# Patient Record
Sex: Male | Born: 1960 | Race: White | Hispanic: No | Marital: Single | Smoking: Never smoker
Health system: Southern US, Community
[De-identification: ages and names within clinical notes are randomized; demographics above are authoritative.]

## PROBLEM LIST (undated history)

## (undated) DIAGNOSIS — E119 Type 2 diabetes mellitus without complications: Secondary | ICD-10-CM

---

## 2010-04-23 ENCOUNTER — Emergency Department: Payer: Self-pay | Admitting: Emergency Medicine

## 2012-05-08 ENCOUNTER — Emergency Department: Payer: Self-pay | Admitting: Emergency Medicine

## 2012-05-08 LAB — CBC
MCH: 32.9 pg (ref 26.0–34.0)
MCHC: 34.6 g/dL (ref 32.0–36.0)
MCV: 95 fL (ref 80–100)
Platelet: 215 10*3/uL (ref 150–440)
RBC: 4.99 10*6/uL (ref 4.40–5.90)
RDW: 12.1 % (ref 11.5–14.5)
WBC: 8.5 10*3/uL (ref 3.8–10.6)

## 2012-09-13 ENCOUNTER — Emergency Department: Payer: Self-pay | Admitting: Emergency Medicine

## 2016-05-19 ENCOUNTER — Emergency Department: Payer: Self-pay

## 2016-05-19 ENCOUNTER — Inpatient Hospital Stay
Admission: EM | Admit: 2016-05-19 | Discharge: 2016-05-22 | DRG: 897 | Disposition: A | Discharge status: Home / Self Care | Payer: Self-pay | Attending: Internal Medicine | Admitting: Internal Medicine

## 2016-05-19 ENCOUNTER — Encounter: Payer: Self-pay | Admitting: Radiology

## 2016-05-19 DIAGNOSIS — E119 Type 2 diabetes mellitus without complications: Secondary | ICD-10-CM | POA: Diagnosis present

## 2016-05-19 DIAGNOSIS — F10239 Alcohol dependence with withdrawal, unspecified: Secondary | ICD-10-CM

## 2016-05-19 DIAGNOSIS — M25572 Pain in left ankle and joints of left foot: Secondary | ICD-10-CM | POA: Diagnosis present

## 2016-05-19 DIAGNOSIS — R296 Repeated falls: Secondary | ICD-10-CM | POA: Diagnosis present

## 2016-05-19 DIAGNOSIS — F10930 Alcohol use, unspecified with withdrawal, uncomplicated: Secondary | ICD-10-CM

## 2016-05-19 DIAGNOSIS — R29898 Other symptoms and signs involving the musculoskeletal system: Secondary | ICD-10-CM

## 2016-05-19 DIAGNOSIS — Z590 Homelessness: Secondary | ICD-10-CM

## 2016-05-19 DIAGNOSIS — F10929 Alcohol use, unspecified with intoxication, unspecified: Secondary | ICD-10-CM

## 2016-05-19 DIAGNOSIS — F10230 Alcohol dependence with withdrawal, uncomplicated: Principal | ICD-10-CM | POA: Diagnosis present

## 2016-05-19 DIAGNOSIS — W19XXXA Unspecified fall, initial encounter: Secondary | ICD-10-CM | POA: Diagnosis present

## 2016-05-19 DIAGNOSIS — D6959 Other secondary thrombocytopenia: Secondary | ICD-10-CM | POA: Diagnosis present

## 2016-05-19 DIAGNOSIS — E876 Hypokalemia: Secondary | ICD-10-CM | POA: Diagnosis present

## 2016-05-19 DIAGNOSIS — Z9181 History of falling: Secondary | ICD-10-CM

## 2016-05-19 DIAGNOSIS — E519 Thiamine deficiency, unspecified: Secondary | ICD-10-CM | POA: Diagnosis present

## 2016-05-19 DIAGNOSIS — F10939 Alcohol use, unspecified with withdrawal, unspecified: Secondary | ICD-10-CM

## 2016-05-19 DIAGNOSIS — I10 Essential (primary) hypertension: Secondary | ICD-10-CM | POA: Diagnosis present

## 2016-05-19 DIAGNOSIS — Z23 Encounter for immunization: Secondary | ICD-10-CM

## 2016-05-19 HISTORY — DX: Type 2 diabetes mellitus without complications: E11.9

## 2016-05-19 LAB — URINE DRUG SCREEN, QUALITATIVE (ARMC ONLY)
Amphetamines, Ur Screen: NOT DETECTED
Barbiturates, Ur Screen: NOT DETECTED
Benzodiazepine, Ur Scrn: NOT DETECTED
CANNABINOID 50 NG, UR ~~LOC~~: NOT DETECTED
COCAINE METABOLITE, UR ~~LOC~~: NOT DETECTED
MDMA (ECSTASY) UR SCREEN: NOT DETECTED
Methadone Scn, Ur: NOT DETECTED
OPIATE, UR SCREEN: NOT DETECTED
PHENCYCLIDINE (PCP) UR S: NOT DETECTED
TRICYCLIC, UR SCREEN: NOT DETECTED

## 2016-05-19 LAB — COMPREHENSIVE METABOLIC PANEL
ALT: 165 U/L — ABNORMAL HIGH (ref 17–63)
AST: 353 U/L — ABNORMAL HIGH (ref 15–41)
Albumin: 4.6 g/dL (ref 3.5–5.0)
Alkaline Phosphatase: 74 U/L (ref 38–126)
Anion gap: 14 (ref 5–15)
BUN: 7 mg/dL (ref 6–20)
CHLORIDE: 99 mmol/L — AB (ref 101–111)
CO2: 21 mmol/L — ABNORMAL LOW (ref 22–32)
Calcium: 8.6 mg/dL — ABNORMAL LOW (ref 8.9–10.3)
Creatinine, Ser: 0.73 mg/dL (ref 0.61–1.24)
GFR calc non Af Amer: >60 mL/min (ref >60)
Glucose, Bld: 166 mg/dL — ABNORMAL HIGH (ref 65–99)
POTASSIUM: 3.4 mmol/L — AB (ref 3.5–5.1)
SODIUM: 134 mmol/L — AB (ref 135–145)
Total Bilirubin: 1.3 mg/dL — ABNORMAL HIGH (ref 0.3–1.2)
Total Protein: 7.8 g/dL (ref 6.5–8.1)

## 2016-05-19 LAB — PROTIME-INR
INR: 1.08
Prothrombin Time: 14 seconds (ref 11.4–15.2)

## 2016-05-19 LAB — CBC
HCT: 41.9 % (ref 40.0–52.0)
Hemoglobin: 14.7 g/dL (ref 13.0–18.0)
MCH: 34 pg (ref 26.0–34.0)
MCHC: 35 g/dL (ref 32.0–36.0)
MCV: 97.1 fL (ref 80.0–100.0)
Platelets: 115 10*3/uL — ABNORMAL LOW (ref 150–440)
RBC: 4.32 MIL/uL — ABNORMAL LOW (ref 4.40–5.90)
RDW: 13.2 % (ref 11.5–14.5)
WBC: 4.2 10*3/uL (ref 3.8–10.6)

## 2016-05-19 LAB — URINALYSIS, ROUTINE W REFLEX MICROSCOPIC
BILIRUBIN URINE: NEGATIVE
Glucose, UA: NEGATIVE mg/dL
Hgb urine dipstick: NEGATIVE
Ketones, ur: NEGATIVE mg/dL
Leukocytes, UA: NEGATIVE
NITRITE: NEGATIVE
PH: 6 (ref 5.0–8.0)
Protein, ur: NEGATIVE mg/dL
SPECIFIC GRAVITY, URINE: 1.017 (ref 1.005–1.030)

## 2016-05-19 LAB — TROPONIN I

## 2016-05-19 LAB — GLUCOSE, CAPILLARY: Glucose-Capillary: 104 mg/dL — ABNORMAL HIGH (ref 65–99)

## 2016-05-19 LAB — MAGNESIUM: MAGNESIUM: 1.9 mg/dL (ref 1.7–2.4)

## 2016-05-19 LAB — APTT: aPTT: 27 seconds (ref 24–36)

## 2016-05-19 LAB — ETHANOL: ALCOHOL ETHYL (B): 346 mg/dL — AB (ref <5)

## 2016-05-19 MED ORDER — DOCUSATE SODIUM 100 MG PO CAPS
100.0000 mg | ORAL_CAPSULE | Freq: Two times a day (BID) | ORAL | Status: DC | PRN
  Administered 2016-05-20: 100 mg via ORAL
  Filled 2016-05-19: qty 1

## 2016-05-19 MED ORDER — LORAZEPAM 2 MG/ML IJ SOLN
1.5000 mg | Freq: Once | INTRAMUSCULAR | Status: AC
  Administered 2016-05-19: 1.5 mg via INTRAVENOUS
  Filled 2016-05-19: qty 1

## 2016-05-19 MED ORDER — PNEUMOCOCCAL VAC POLYVALENT 25 MCG/0.5ML IJ INJ
0.5000 mL | INJECTION | INTRAMUSCULAR | Status: AC
  Administered 2016-05-20: 08:00:00 0.5 mL via INTRAMUSCULAR
  Filled 2016-05-19: qty 0.5

## 2016-05-19 MED ORDER — SODIUM CHLORIDE 0.9 % IV BOLUS (SEPSIS)
1000.0000 mL | Freq: Once | INTRAVENOUS | Status: AC
  Administered 2016-05-19: 1000 mL via INTRAVENOUS

## 2016-05-19 MED ORDER — LORAZEPAM 2 MG/ML IJ SOLN
2.0000 mg | INTRAMUSCULAR | Status: DC | PRN
  Administered 2016-05-19: 2 mg via INTRAVENOUS
  Filled 2016-05-19: qty 1

## 2016-05-19 MED ORDER — SODIUM CHLORIDE 0.9 % IV SOLN
INTRAVENOUS | Status: DC
  Administered 2016-05-19 – 2016-05-20 (×4): via INTRAVENOUS

## 2016-05-19 MED ORDER — THIAMINE HCL 100 MG/ML IJ SOLN: 100.0000 mg | Freq: Every day | INTRAMUSCULAR | Status: DC

## 2016-05-19 MED ORDER — LORAZEPAM 2 MG PO TABS
2.0000 mg | ORAL_TABLET | ORAL | Status: DC | PRN
  Administered 2016-05-20 – 2016-05-22 (×8): 2 mg via ORAL
  Filled 2016-05-19 (×9): qty 1

## 2016-05-19 MED ORDER — INSULIN ASPART 100 UNIT/ML ~~LOC~~ SOLN
0.0000 [IU] | Freq: Three times a day (TID) | SUBCUTANEOUS | Status: DC
  Administered 2016-05-21: 1 [IU] via SUBCUTANEOUS
  Filled 2016-05-19: qty 1

## 2016-05-19 MED ORDER — HEPARIN SODIUM (PORCINE) 5000 UNIT/ML IJ SOLN
5000.0000 [IU] | Freq: Three times a day (TID) | INTRAMUSCULAR | Status: DC
  Administered 2016-05-20 – 2016-05-22 (×7): 5000 [IU] via SUBCUTANEOUS
  Filled 2016-05-19 (×7): qty 1

## 2016-05-19 MED ORDER — ADULT MULTIVITAMIN W/MINERALS CH
1.0000 | ORAL_TABLET | Freq: Every day | ORAL | Status: DC
  Administered 2016-05-19 – 2016-05-22 (×4): 1 via ORAL
  Filled 2016-05-19 (×4): qty 1

## 2016-05-19 MED ORDER — VITAMIN B-1 100 MG PO TABS
100.0000 mg | ORAL_TABLET | Freq: Every day | ORAL | Status: DC
  Administered 2016-05-19 – 2016-05-22 (×4): 100 mg via ORAL
  Filled 2016-05-19 (×4): qty 1

## 2016-05-19 MED ORDER — ASPIRIN 81 MG PO CHEW
324.0000 mg | CHEWABLE_TABLET | Freq: Once | ORAL | Status: AC
  Administered 2016-05-19: 324 mg via ORAL
  Filled 2016-05-19: qty 4

## 2016-05-19 MED ORDER — IOPAMIDOL (ISOVUE-370) INJECTION 76%
75.0000 mL | Freq: Once | INTRAVENOUS | Status: AC | PRN
  Administered 2016-05-19: 75 mL via INTRAVENOUS

## 2016-05-19 MED ORDER — FOLIC ACID 1 MG PO TABS
1.0000 mg | ORAL_TABLET | Freq: Every day | ORAL | Status: DC
  Administered 2016-05-19 – 2016-05-22 (×4): 1 mg via ORAL
  Filled 2016-05-19 (×5): qty 1
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
## 2016-05-19 NOTE — H&P (Signed)
[facility] [place] [facility]   PATIENT NAME: Tyler Sexton    MR#:  [id number]  DATE OF BIRTH:  [birth year]  DATE OF ADMISSION:  05/19/2016  PRIMARY CARE PHYSICIAN: No PCP Per Patient   REQUESTING/REFERRING PHYSICIAN: Quale  CHIEF COMPLAINT:   Chief Complaint  Patient presents with  . Alcohol Intoxication    HISTORY OF PRESENT ILLNESS: Tyler Sexton  is a 56 y.o. 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 He had 2-3 falls in last few weeks because of alcoholism and have some pain in his left ankle due to a fall. For last 2-3 days he started having some headache and he also have complain of palpitation. He have his right leg weakness also and he feels he will be falling down if he tried to stand up concerned with this he came to emergency room. CT scan of the head and CT angiogram are negative for any acute injuries his x-ray on left feet also does not show any acute fractures but he appears to be going into alcohol withdrawal and so given to hospitalist team for further management as patient agrees he would like to quit alcohol. There is also a concern by ER physician offered him having a stroke so suggested to do an MRI on the brain  PAST MEDICAL HISTORY:   Past Medical History:  Diagnosis Date  . Diabetes mellitus without complication (HCC)     PAST SURGICAL HISTORY: No past surgical history on file.  SOCIAL HISTORY:  Social History  Substance Use Topics  . Smoking status: Never Smoker  . Smokeless tobacco: Not on file  . Alcohol use Yes    FAMILY HISTORY:  Family History  Problem Relation Age of Onset  . Cancer Mother   . Cancer Father     DRUG ALLERGIES: No Known Allergies  REVIEW OF SYSTEMS:   CONSTITUTIONAL: No fever, fatigue or  weakness. He have imbalance. EYES: No blurred or double vision.  EARS, NOSE, AND THROAT: No tinnitus or ear pain.  RESPIRATORY: No cough, shortness of breath, wheezing or hemoptysis.  CARDIOVASCULAR: No chest pain, orthopnea, edema.  GASTROINTESTINAL: No nausea, vomiting, diarrhea or abdominal pain.  GENITOURINARY: No dysuria, hematuria.  ENDOCRINE: No polyuria, nocturia,  HEMATOLOGY: No anemia, easy bruising or bleeding SKIN: No rash or lesion. MUSCULOSKELETAL: No joint pain or arthritis. Left ankle pain.  NEUROLOGIC: No tingling, numbness, weakness.  PSYCHIATRY: No anxiety or depression.   MEDICATIONS AT HOME:  Prior to Admission medications   Not on File      PHYSICAL EXAMINATION:   VITAL SIGNS: Blood pressure (!) 155/99, pulse (!) 120, temperature 98.9 F (37.2 C), resp. rate 18, height  (1.626 m), weight 79.4 kg (175 lb), SpO2 94 %.  GENERAL:  56 y.o.-year-old patient lying in the bed with no acute distress.  EYES: Pupils equal, round, reactive to light and accommodation. No scleral icterus. Extraocular muscles intact.  HEENT: Head atraumatic, normocephalic. Oropharynx and nasopharynx clear.  NECK:  Supple, no jugular venous distention. No thyroid enlargement, no tenderness.  LUNGS: Normal breath sounds bilaterally, no wheezing, rales,rhonchi or crepitation. No use of accessory muscles of respiration.  CARDIOVASCULAR: S1, S2 normal. No murmurs, rubs, or gallops.  ABDOMEN: Soft,  nontender, nondistended. Bowel sounds present. No organomegaly or mass.  EXTREMITIES: No pedal edema, cyanosis, or clubbing. Tender on pressure on left ankle. NEUROLOGIC: Cranial nerves II through XII are intact. Muscle strength 5/5 in all extremities. Sensation intact. Gait not checked. He has some shaking on his limbs. PSYCHIATRIC: The patient is alert and oriented x 3.  SKIN: No obvious rash, lesion, or ulcer. His face and chest and his arms appear flushed. he appears in early alcohol  withdrawal.  LABORATORY PANEL:   CBC  Recent Labs Lab 05/19/16 1752  WBC 4.2  HGB 14.7  HCT 41.9  PLT 115*  MCV 97.1  MCH 34.0  MCHC 35.0  RDW 13.2   ------------------------------------------------------------------------------------------------------------------  Chemistries   Recent Labs Lab 05/19/16 1752  NA 134*  K 3.4*  CL 99*  CO2 21*  GLUCOSE 166*  BUN 7  CREATININE 0.73  CALCIUM 8.6*  AST 353*  ALT 165*  ALKPHOS 74  BILITOT 1.3*   ------------------------------------------------------------------------------------------------------------------ estimated creatinine clearance is 100.5 mL/min (by C-G formula based on SCr of 0.73 mg/dL). ------------------------------------------------------------------------------------------------------------------ No results for input(s): TSH, T4TOTAL, T3FREE, THYROIDAB in the last 72 hours.  Invalid input(s): FREET3   Coagulation profile  Recent Labs Lab 05/19/16 1920  INR 1.08   ------------------------------------------------------------------------------------------------------------------- No results for input(s): DDIMER in the last 72 hours. -------------------------------------------------------------------------------------------------------------------  Cardiac Enzymes  Recent Labs Lab 05/19/16 1920  TROPONINI <0.03   ------------------------------------------------------------------------------------------------------------------ Invalid input(s): POCBNP  ---------------------------------------------------------------------------------------------------------------  Urinalysis No results found for: COLORURINE, APPEARANCEUR, LABSPEC, PHURINE, GLUCOSEU, HGBUR, BILIRUBINUR, KETONESUR, PROTEINUR, UROBILINOGEN, NITRITE, LEUKOCYTESUR   RADIOLOGY: Ct Angio Head W Or Wo Contrast  Result Date: 05/19/2016 CLINICAL DATA:  Acute right leg weakness and numbness. EXAM: CT ANGIOGRAPHY HEAD AND NECK  TECHNIQUE: Multidetector CT imaging of the head and neck was performed using the standard protocol during bolus administration of intravenous contrast. Multiplanar CT image reconstructions and MIPs were obtained to evaluate the vascular anatomy. Carotid stenosis measurements (when applicable) are obtained utilizing NASCET criteria, using the distal internal carotid diameter as the denominator. CONTRAST:  75 mL Isovue 370 COMPARISON:  None. FINDINGS: CT HEAD FINDINGS Brain: There is no evidence of acute cortical infarct, intracranial hemorrhage, mass, midline shift, or extra-axial fluid collection. There is very mild cerebral and cerebellar atrophy. Vascular: Calcified atherosclerosis at the skullbase. No hyperdense vessel. Skull: No fracture or focal osseous lesion. Sinuses: Visualized paranasal sinuses and mastoid air cells are clear. Orbits: Unremarkable. Review of the MIP images confirms the above findings CTA NECK FINDINGS Aortic arch: Standard 3 vessel aortic arch with mild calcified atherosclerotic plaque. Widely patent brachiocephalic and subclavian arteries. Right carotid system: Patent without evidence of dissection. Calcified plaque is mild in the proximal and mid common carotid artery and moderate about the carotid bifurcation without significant stenosis. Left carotid system: Patent without evidence of dissection. Mild-to-moderate calcified plaque in the mid common carotid artery and about the carotid bifurcation without stenosis. Vertebral arteries: Patent without evidence of dissection or stenosis. Moderately to strongly dominant left vertebral artery. Skeleton: Mild lower cervical disc degeneration. Other neck: No mass or lymph node enlargement. Upper chest: Clear lung apices. Review of the MIP images confirms the above findings CTA HEAD FINDINGS Anterior circulation: The internal carotid arteries are patent from skullbase to carotid termini. There is mild right and moderate left carotid siphon  calcified plaque without significant stenosis. ACAs and MCAs are patent without evidence of major branch occlusion or significant stenosis. No aneurysm. Posterior circulation: The intracranial vertebral arteries are patent without  stenosis. The right vertebral artery is markedly hypoplastic distal to the PICA origin. Left PICA also appears patent. Left AICA appears dominant. SCA origins are patent bilaterally. Basilar artery is widely patent. There is a patent left posterior communicating artery with hypoplastic left P1 segment. PCAs are patent without evidence of significant stenosis. No aneurysm. Venous sinuses: Patent. Anatomic variants: Hypoplastic left P1. Delayed phase: No abnormal enhancement. Review of the MIP images confirms the above findings IMPRESSION: 1. No evidence of acute intracranial abnormality. 2. No large vessel occlusion. 3. 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 Electronically Signed   By: Sebastian Ache M.D.   On: 05/19/2016 19:20   Ct Angio Neck W And/or Wo Contrast  Result Date: 05/19/2016 CLINICAL DATA:  Acute right leg weakness and numbness. EXAM: CT ANGIOGRAPHY HEAD AND NECK TECHNIQUE: Multidetector CT imaging of the head and neck was performed using the standard protocol during bolus administration of intravenous contrast. Multiplanar CT image reconstructions and MIPs were obtained to evaluate the vascular anatomy. Carotid stenosis measurements (when applicable) are obtained utilizing NASCET criteria, using the distal internal carotid diameter as the denominator. CONTRAST:  75 mL Isovue 370 COMPARISON:  None. FINDINGS: CT HEAD FINDINGS Brain: There is no evidence of acute cortical infarct, intracranial hemorrhage, mass, midline shift, or extra-axial fluid collection. There is very mild cerebral and cerebellar atrophy. Vascular: Calcified atherosclerosis at the skullbase. No hyperdense vessel. Skull: No fracture or focal osseous lesion.  Sinuses: Visualized paranasal sinuses and mastoid air cells are clear. Orbits: Unremarkable. Review of the MIP images confirms the above findings CTA NECK FINDINGS Aortic arch: Standard 3 vessel aortic arch with mild calcified atherosclerotic plaque. Widely patent brachiocephalic and subclavian arteries. Right carotid system: Patent without evidence of dissection. Calcified plaque is mild in the proximal and mid common carotid artery and moderate about the carotid bifurcation without significant stenosis. Left carotid system: Patent without evidence of dissection. Mild-to-moderate calcified plaque in the mid common carotid artery and about the carotid bifurcation without stenosis. Vertebral arteries: Patent without evidence of dissection or stenosis. Moderately to strongly dominant left vertebral artery. Skeleton: Mild lower cervical disc degeneration. Other neck: No mass or lymph node enlargement. Upper chest: Clear lung apices. Review of the MIP images confirms the above findings CTA HEAD FINDINGS Anterior circulation: The internal carotid arteries are patent from skullbase to carotid termini. There is mild right and moderate left carotid siphon calcified plaque without significant stenosis. ACAs and MCAs are patent without evidence of major branch occlusion or significant stenosis. No aneurysm. Posterior circulation: The intracranial vertebral arteries are patent without stenosis. The right vertebral artery is markedly hypoplastic distal to the PICA origin. Left PICA also appears patent. Left AICA appears dominant. SCA origins are patent bilaterally. Basilar artery is widely patent. There is a patent left posterior communicating artery with hypoplastic left P1 segment. PCAs are patent without evidence of significant stenosis. No aneurysm. Venous sinuses: Patent. Anatomic variants: Hypoplastic left P1. Delayed phase: No abnormal enhancement. Review of the MIP images confirms the above findings IMPRESSION: 1. No  evidence of acute intracranial abnormality. 2. No large vessel occlusion. 3. 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 Electronically Signed   By: Sebastian Ache M.D.   On: 05/19/2016 19:20   Dg Foot Complete Left  Result Date: 05/19/2016 CLINICAL DATA:  Status post fall, with left foot pain. Initial encounter. EXAM: LEFT FOOT - COMPLETE 3+ VIEW COMPARISON:  None. FINDINGS: There is no evidence of fracture or  dislocation. The joint spaces are preserved. There is no evidence of talar subluxation; the subtalar joint is unremarkable in appearance. A plantar calcaneal spur is noted. An ankle joint effusion is noted. IMPRESSION: 1. No evidence of fracture or dislocation. 2. Ankle joint effusion seen. Electronically Signed   By: Roanna Raider M.D.   On: 05/19/2016 19:52    EKG: Orders placed or performed during the hospital encounter of 05/19/16  . ED EKG  . ED EKG    IMPRESSION AND PLAN:  * Alcohol withdrawal   Patient is at very high risk of going into alcohol withdrawal he already appears to have early signs of withdrawal.   We'll give IV fluids, give Ativan IV and by mouth as needed.    * Bilateral lower activity weakness    CT had an angiogram are negative.    Most likely this may be secondary to thiamine deficiency   We will give IV, check his magnesium and replace if needed.  * Diabetes   Keep on insulin sliding scale coverage.  * Elevated LFTs   Likely due to excessive alcoholism, continue monitoring.  All the records are reviewed and case discussed with ED provider. Management plans discussed with the patient, family and they are in agreement.  CODE STATUS: Full code Code Status History    This patient does not have a recorded code status. Please follow your organizational policy for patients in this situation.       TOTAL TIME TAKING CARE OF THIS PATIENT: 50 minutes.    Altamese Dilling M.D on 05/19/2016   Between 7am to  6pm - Pager - [phone]  After 6pm go to [email or web address] - Social research officer, government  [facility]  Office  [phone]  CC: Primary care physician; No PCP Per Patient   Note: This dictation was prepared with Dragon dictation along with smaller phrase technology. Any transcriptional errors that result from this process are unintentional.

## 2016-05-19 NOTE — ED Provider Notes (Signed)
[facility] Emergency Department Provider Note   ____________________________________________   First MD Initiated Contact with Patient 05/19/16 1826     (approximate)  I have reviewed the triage vital signs and the nursing notes.   HISTORY  Chief Complaint Not feeling right, can't walk   HPI Tyler Sexton is a 56 y.o. male reports that this morning he just hasn't been feeling right, and feeling slightly nauseated. No fever neck pain or headache. He does however report that feels like his right leg is numb, can't seem to use it very well. He feels somewhat weak all over, in the last couple hours is hard to start to feel like its racing, he started feeling tremulous, and thinks he starting to "go into withdrawals. He reports he drank over 16 beers daily for about 10 years now.  He was not intending to stop today, but reports he feels so weak, tired, and can't walk that he hasn't been able to drink anything since this morning  He reports rather abruptly noticing when he woke up at about 8 AM that his right leg just in feel right today. He went to bed last night, was normal at that time but not exactly sure what time he went to bed. [place] and is not quite aware of the time that he would've gone to bed.   Past Medical History:  Diagnosis Date  . Diabetes mellitus without complication (HCC)    Denies previous medical history, but thinks she may have at least a history of high blood pressure Patient Active Problem List   Diagnosis Date Noted  . Alcohol withdrawal (HCC) 05/19/2016  takes no medications  History reviewed. No pertinent surgical history.  Prior to Admission medications   Not on File    Allergies Patient has no known allergies.  Family History  Problem Relation Age of Onset  . Cancer Mother   . Cancer Father     Social History Social History  Substance Use Topics  . Smoking status: Never Smoker  . Smokeless  tobacco: Never Used  . Alcohol use 10.8 oz/week    18 Cans of beer per week  he uses alcohol regularly He does not use any illicit drugs  Review of Systems Constitutional: No fever/chills Eyes: No visual changes. ENT: No sore throat. Cardiovascular: Denies chest pain.heart feels like it's racing slightly Respiratory: Denies shortness of breath. Gastrointestinal: No abdominal pain.  No vomiting.  No diarrhea.  No constipation. Genitourinary: Negative for dysuria. Musculoskeletal: Negative for back pain. Skin: Negative for rash. Neurological: Negative for headaches.  10-point ROS otherwise negative.  ____________________________________________   PHYSICAL EXAM:  VITAL SIGNS: ED Triage Vitals [05/19/16 1753]  Enc Vitals Group     BP (!) 155/99     Pulse Rate (!) 121     Resp 18     Temp 98.2 F (36.8 C)     Temp Source Oral     SpO2 100 %     Weight 175 lb (79.4 kg)     Height 5\' 4"  (1.626 m)     Head Circumference      Peak Flow      Pain Score 0     Pain Loc      Pain Edu?      Excl. in GC?     Constitutional: Alert and oriented. Slightly anxious, slightly tremulous Eyes: Conjunctivae are normal. PERRL. EOMI. Head: Atraumatic. Nose: No congestion/rhinnorhea. Mouth/Throat: Mucous membranes are moist.  Oropharynx non-erythematous. Neck: No stridor.  No rigidity. Cardiovascular: tachycardic rate, regular rhythm. Grossly normal heart sounds.  Good peripheral circulation. Respiratory: Normal respiratory effort.  No retractions. Lungs CTAB. Gastrointestinal: Soft and nontender. No distention.  usculoskeletal: No lower extremity tenderness nor edema.  No joint effusions. Neurologic:  Normal speech and language.   NIH score equals 3, performed by me at bedside. The patient has right leg pronator drift. The patient has normal cranial nerve exam. Extraocular movements are normal. Visual fields are normal. Patient has 5 out of 5 strength in all extremities except the  right lower extremity which is approximately 4 out of 5. There is no numbness or gross, acute sensory abnormality in the extremities bilaterallyexcept for the right lower thigh foot and leg where he reports inability to discriminate touch. No speech disturbance. No dysarthria. No aphasia. No ataxia. Normal finger nose finger bilat. Patient speaking in full and clear sentences. Moderate upper extremity bilateral tremulousness   Skin:  Skin is warm, dry and intact. No rash noted. Psychiatric: Mood and affect are normal. Speech and behavior are normal.  ____________________________________________   LABS (all labs ordered are listed, but only abnormal results are displayed)  Labs Reviewed  COMPREHENSIVE METABOLIC PANEL - Abnormal; Notable for the following:       Result Value   Sodium 134 (*)    Potassium 3.4 (*)    Chloride 99 (*)    CO2 21 (*)    Glucose, Bld 166 (*)    Calcium 8.6 (*)    AST 353 (*)    ALT 165 (*)    Total Bilirubin 1.3 (*)    All other components within normal limits  ETHANOL - Abnormal; Notable for the following:    Alcohol, Ethyl (B) 346 (*)    All other components within normal limits  CBC - Abnormal; Notable for the following:    RBC 4.32 (*)    Platelets 115 (*)    All other components within normal limits  URINALYSIS, ROUTINE W REFLEX MICROSCOPIC - Abnormal; Notable for the following:    Color, Urine YELLOW (*)    APPearance CLEAR (*)    All other components within normal limits  GLUCOSE, CAPILLARY - Abnormal; Notable for the following:    Glucose-Capillary 104 (*)    All other components within normal limits  APTT  TROPONIN I  PROTIME-INR  MAGNESIUM  URINE DRUG SCREEN, QUALITATIVE ([facility] ONLY)  BASIC METABOLIC PANEL  CBC  HIV ANTIBODY (ROUTINE TESTING)  CBG MONITORING, ED   ____________________________________________  EKG  Reviewed and interpreted by me at 1906 Sinus tachycardia, heart rate 120 QRS 80 QTc 440 No acute ischemic  changes, normal T waves ____________________________________________  RADIOLOGY  Ct Angio Head W Or Wo Contrast  Result Date: 05/19/2016 CLINICAL DATA:  Acute right leg weakness and numbness. EXAM: CT ANGIOGRAPHY HEAD AND NECK TECHNIQUE: Multidetector CT imaging of the head and neck was performed using the standard protocol during bolus administration of intravenous contrast. Multiplanar CT image reconstructions and MIPs were obtained to evaluate the vascular anatomy. Carotid stenosis measurements (when applicable) are obtained utilizing NASCET criteria, using the distal internal carotid diameter as the denominator. CONTRAST:  75 mL Isovue 370 COMPARISON:  None. FINDINGS: CT HEAD FINDINGS Brain: There is no evidence of acute cortical infarct, intracranial hemorrhage, mass, midline shift, or extra-axial fluid collection. There is very mild cerebral and cerebellar atrophy. Vascular: Calcified atherosclerosis at the skullbase. No hyperdense vessel. Skull: No fracture or focal osseous lesion.  Sinuses: Visualized paranasal sinuses and mastoid air cells are clear. Orbits: Unremarkable. Review of the MIP images confirms the above findings CTA NECK FINDINGS Aortic arch: Standard 3 vessel aortic arch with mild calcified atherosclerotic plaque. Widely patent brachiocephalic and subclavian arteries. Right carotid system: Patent without evidence of dissection. Calcified plaque is mild in the proximal and mid common carotid artery and moderate about the carotid bifurcation without significant stenosis. Left carotid system: Patent without evidence of dissection. Mild-to-moderate calcified plaque in the mid common carotid artery and about the carotid bifurcation without stenosis. Vertebral arteries: Patent without evidence of dissection or stenosis. Moderately to strongly dominant left vertebral artery. Skeleton: Mild lower cervical disc degeneration. Other neck: No mass or lymph node enlargement. Upper chest: Clear lung  apices. Review of the MIP images confirms the above findings CTA HEAD FINDINGS Anterior circulation: The internal carotid arteries are patent from skullbase to carotid termini. There is mild right and moderate left carotid siphon calcified plaque without significant stenosis. ACAs and MCAs are patent without evidence of major branch occlusion or significant stenosis. No aneurysm. Posterior circulation: The intracranial vertebral arteries are patent without stenosis. The right vertebral artery is markedly hypoplastic distal to the PICA origin. Left PICA also appears patent. Left AICA appears dominant. SCA origins are patent bilaterally. Basilar artery is widely patent. There is a patent left posterior communicating artery with hypoplastic left P1 segment. PCAs are patent without evidence of significant stenosis. No aneurysm. Venous sinuses: Patent. Anatomic variants: Hypoplastic left P1. Delayed phase: No abnormal enhancement. Review of the MIP images confirms the above findings IMPRESSION: 1. No evidence of acute intracranial abnormality. 2. No large vessel occlusion. 3. 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 Electronically Signed   By: Sebastian Ache M.D.   On: 05/19/2016 19:20   Ct Angio Neck W And/or Wo Contrast  Result Date: 05/19/2016 CLINICAL DATA:  Acute right leg weakness and numbness. EXAM: CT ANGIOGRAPHY HEAD AND NECK TECHNIQUE: Multidetector CT imaging of the head and neck was performed using the standard protocol during bolus administration of intravenous contrast. Multiplanar CT image reconstructions and MIPs were obtained to evaluate the vascular anatomy. Carotid stenosis measurements (when applicable) are obtained utilizing NASCET criteria, using the distal internal carotid diameter as the denominator. CONTRAST:  75 mL Isovue 370 COMPARISON:  None. FINDINGS: CT HEAD FINDINGS Brain: There is no evidence of acute cortical infarct, intracranial hemorrhage,  mass, midline shift, or extra-axial fluid collection. There is very mild cerebral and cerebellar atrophy. Vascular: Calcified atherosclerosis at the skullbase. No hyperdense vessel. Skull: No fracture or focal osseous lesion. Sinuses: Visualized paranasal sinuses and mastoid air cells are clear. Orbits: Unremarkable. Review of the MIP images confirms the above findings CTA NECK FINDINGS Aortic arch: Standard 3 vessel aortic arch with mild calcified atherosclerotic plaque. Widely patent brachiocephalic and subclavian arteries. Right carotid system: Patent without evidence of dissection. Calcified plaque is mild in the proximal and mid common carotid artery and moderate about the carotid bifurcation without significant stenosis. Left carotid system: Patent without evidence of dissection. Mild-to-moderate calcified plaque in the mid common carotid artery and about the carotid bifurcation without stenosis. Vertebral arteries: Patent without evidence of dissection or stenosis. Moderately to strongly dominant left vertebral artery. Skeleton: Mild lower cervical disc degeneration. Other neck: No mass or lymph node enlargement. Upper chest: Clear lung apices. Review of the MIP images confirms the above findings CTA HEAD FINDINGS Anterior circulation: The internal carotid arteries are patent from skullbase to carotid  termini. There is mild right and moderate left carotid siphon calcified plaque without significant stenosis. ACAs and MCAs are patent without evidence of major branch occlusion or significant stenosis. No aneurysm. Posterior circulation: The intracranial vertebral arteries are patent without stenosis. The right vertebral artery is markedly hypoplastic distal to the PICA origin. Left PICA also appears patent. Left AICA appears dominant. SCA origins are patent bilaterally. Basilar artery is widely patent. There is a patent left posterior communicating artery with hypoplastic left P1 segment. PCAs are patent  without evidence of significant stenosis. No aneurysm. Venous sinuses: Patent. Anatomic variants: Hypoplastic left P1. Delayed phase: No abnormal enhancement. Review of the MIP images confirms the above findings IMPRESSION: 1. No evidence of acute intracranial abnormality. 2. No large vessel occlusion. 3. 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 Electronically Signed   By: Sebastian Ache M.D.   On: 05/19/2016 19:20   Dg Foot Complete Left  Result Date: 05/19/2016 CLINICAL DATA:  Status post fall, with left foot pain. Initial encounter. EXAM: LEFT FOOT - COMPLETE 3+ VIEW COMPARISON:  None. FINDINGS: There is no evidence of fracture or dislocation. The joint spaces are preserved. There is no evidence of talar subluxation; the subtalar joint is unremarkable in appearance. A plantar calcaneal spur is noted. An ankle joint effusion is noted. IMPRESSION: 1. No evidence of fracture or dislocation. 2. Ankle joint effusion seen. Electronically Signed   By: Roanna Raider M.D.   On: 05/19/2016 19:52    ____________________________________________   PROCEDURES  Procedure(s) performed: None  Procedures  Critical Care performed: Yes, see critical care note(s)  CRITICAL CARE Performed by: Sharyn Creamer   Total critical care time: 40 minutes  Critical care time was exclusive of separately billable procedures and treating other patients.  Critical care was necessary to treat or prevent imminent or life-threatening deterioration.  Critical care was time spent personally by me on the following activities: development of treatment plan with patient and/or surrogate as well as nursing, discussions with consultants, evaluation of patient's response to treatment, examination of patient, obtaining history from patient or surrogate, ordering and performing treatments and interventions, ordering and review of laboratory studies, ordering and review of radiographic studies,  pulse oximetry and re-evaluation of patient's condition.  Patient presents with concerns of an acute neurologic deficit including sudden weakness in the right lower leg. He is outside of the TPA window, and given his history I'm acutely concerned about a potential intracranial hemorrhage. No signs or symptoms of subarachnoid hemorrhage based on history. We'll proceed with CT angiography to further evaluate. In addition he does now appear to be going through withdrawal, I will start him on Ativan. I'm concerned that the patient's having an acute medical event leading to his withdrawal, I am performing a broad workup at this time.He denies any infectious symptoms, but given his long-standing alcoholism this is still on differential. ____________________________________________   INITIAL IMPRESSION / ASSESSMENT AND PLAN / ED COURSE  Pertinent labs & imaging results that were available during my care of the patient were reviewed by me and considered in my medical decision making (see chart for details).  ----------------------------------------- 7:28 PM on 05/19/2016 -----------------------------------------  Patient's labs reviewed, somewhat expected slight elevation of transaminases given his heavy alcohol history. Alcohol level was quite elevated, and considering he is tremulous now like concerned he will develop significant withdrawals. At this point, the patient's tremors and vital signs have improved after Ativan, his CT does not demonstrate an acute stroke but  concern is still elevated given weakness numbness in the right foot. We'll give oral aspirin, admitted to hospital for further workup include treatment of detox, evaluation for neurologic deficit, and further workup about today's presentation. The patient is agreeable to plan reports feeling improved at this time.  He also reports he fell a few days ago and is having pain over the left heel, I have ordered an x-ray of this which will be  followed up as an inpatient. Foot x-ray neg for fracture.      ____________________________________________   FINAL CLINICAL IMPRESSION(S) / ED DIAGNOSES  Final diagnoses:  Alcoholic intoxication with complication (HCC)  Alcohol withdrawal syndrome without complication (HCC)  Weakness of right leg      NEW MEDICATIONS STARTED DURING THIS VISIT:  There are no discharge medications for this patient.    Note:  This document was prepared using Dragon voice recognition software and may include unintentional dictation errors.     Sharyn Creamer, MD 05/19/16 2322

## 2016-05-19 NOTE — ED Triage Notes (Addendum)
Pt to ed with c/o wanting detox.  Pt states he has been drinking about 18 cans of beer everyday for several years. Pt reports "I can't stand up, I will fall down" pt is homeless.  Pt denies SI, denies HI.

## 2016-05-20 LAB — BASIC METABOLIC PANEL
Anion gap: 8 (ref 5–15)
BUN: 6 mg/dL (ref 6–20)
CHLORIDE: 108 mmol/L (ref 101–111)
CO2: 24 mmol/L (ref 22–32)
CREATININE: 0.5 mg/dL — AB (ref 0.61–1.24)
Calcium: 8 mg/dL — ABNORMAL LOW (ref 8.9–10.3)
GFR calc Af Amer: >60 mL/min (ref >60)
GFR calc non Af Amer: >60 mL/min (ref >60)
GLUCOSE: 92 mg/dL (ref 65–99)
POTASSIUM: 3.3 mmol/L — AB (ref 3.5–5.1)
SODIUM: 140 mmol/L (ref 135–145)

## 2016-05-20 LAB — COMPREHENSIVE METABOLIC PANEL
ALT: 148 U/L — AB (ref 17–63)
AST: 289 U/L — ABNORMAL HIGH (ref 15–41)
Albumin: 4.2 g/dL (ref 3.5–5.0)
Alkaline Phosphatase: 73 U/L (ref 38–126)
Anion gap: 12 (ref 5–15)
BILIRUBIN TOTAL: 2.4 mg/dL — AB (ref 0.3–1.2)
BUN: 5 mg/dL — ABNORMAL LOW (ref 6–20)
CALCIUM: 9 mg/dL (ref 8.9–10.3)
CO2: 26 mmol/L (ref 22–32)
CREATININE: 0.57 mg/dL — AB (ref 0.61–1.24)
Chloride: 99 mmol/L — ABNORMAL LOW (ref 101–111)
Glucose, Bld: 116 mg/dL — ABNORMAL HIGH (ref 65–99)
Potassium: 3.2 mmol/L — ABNORMAL LOW (ref 3.5–5.1)
Sodium: 137 mmol/L (ref 135–145)
TOTAL PROTEIN: 6.9 g/dL (ref 6.5–8.1)

## 2016-05-20 LAB — GLUCOSE, CAPILLARY
GLUCOSE-CAPILLARY: 83 mg/dL (ref 65–99)
GLUCOSE-CAPILLARY: 93 mg/dL (ref 65–99)
GLUCOSE-CAPILLARY: 137 mg/dL — AB (ref 65–99)
Glucose-Capillary: 95 mg/dL (ref 65–99)

## 2016-05-20 LAB — CBC
HEMATOCRIT: 35.6 % — AB (ref 40.0–52.0)
Hemoglobin: 12.4 g/dL — ABNORMAL LOW (ref 13.0–18.0)
MCH: 34.4 pg — AB (ref 26.0–34.0)
MCHC: 34.8 g/dL (ref 32.0–36.0)
MCV: 98.6 fL (ref 80.0–100.0)
PLATELETS: 71 10*3/uL — AB (ref 150–440)
RBC: 3.61 MIL/uL — ABNORMAL LOW (ref 4.40–5.90)
RDW: 13.2 % (ref 11.5–14.5)
WBC: 2.9 10*3/uL — AB (ref 3.8–10.6)

## 2016-05-20 MED ORDER — MAGNESIUM HYDROXIDE 400 MG/5ML PO SUSP
30.0000 mL | Freq: Every day | ORAL | Status: DC | PRN
  Administered 2016-05-20: 30 mL via ORAL
  Filled 2016-05-20: qty 30

## 2016-05-20 MED ORDER — LISINOPRIL 10 MG PO TABS
10.0000 mg | ORAL_TABLET | Freq: Every day | ORAL | Status: DC
  Administered 2016-05-20 – 2016-05-22 (×3): 10 mg via ORAL
  Filled 2016-05-20 (×3): qty 1

## 2016-05-20 MED ORDER — HYDROCHLOROTHIAZIDE 25 MG PO TABS
25.0000 mg | ORAL_TABLET | Freq: Every day | ORAL | Status: DC
  Administered 2016-05-20 – 2016-05-22 (×3): 25 mg via ORAL
  Filled 2016-05-20 (×3): qty 1

## 2016-05-20 MED ORDER — ACETAMINOPHEN 325 MG PO TABS
650.0000 mg | ORAL_TABLET | Freq: Four times a day (QID) | ORAL | Status: DC | PRN
  Administered 2016-05-20 (×2): 650 mg via ORAL
  Filled 2016-05-20 (×2): qty 2

## 2016-05-20 NOTE — Progress Notes (Signed)
Pt reports he is hungry; alert/talking with no reported problems swallowing; denies abdominal pain. MD notified for diet order, need for pain med for HA, no BM 5-6 days, elevated BP reading with pt reporting history of high blood pressure and states ," I was suppose to take medicine for it, but I never got it." Orders obtained. Assisted pt in placing diet order stating, "I don't have my glasses" ;reports hasn't eaten food in 5-6 days.

## 2016-05-20 NOTE — Clinical Social Work Note (Signed)
CSW received consult for homeless issues. CSW will visit when able.  Argentina Ponder, MSW, Theresia Majors [phone]

## 2016-05-20 NOTE — Clinical Social Work Note (Signed)
Clinical Social Work Assessment  Patient Details  Name: Tyler Sexton MRN: [id number] Date of Birth: 04-25-1960  Date of referral:  05/20/16               Reason for consult:  Substance Use/ETOH Abuse, Housing Concerns/Homelessness                Permission sought to share information with:    Permission granted to share information::     Name::        Agency::     Relationship::     Contact Information:     Housing/Transportation Living arrangements for the past 2 months:  Homeless Source of Information:  Patient Patient Interpreter Needed:  None Criminal Activity/Legal Involvement Pertinent to Current Situation/Hospitalization:  No - Comment as needed Significant Relationships:  None Lives with:  Self Do you feel safe going back to the place where you live?  No Need for family participation in patient care:  No (Coment)  Care giving concerns: Patient is homeless/significant alcohol use   Social Worker assessment / plan:  CSW met with patient at bedside to discuss homelessness and alcohol use. The patient reported [place], and he has no family or community support. He works as a Curator, and he primarily spends money on beer. He reports that he had not eaten in a week. The patient also reports that he typically drinks "an 18 pack of beer a day." This frequency has lasted at least the past year.  The CSW provided the patient with information about contacting the local shelter, as well as inpatient rehabilitation services. In addition, the CSW explained how different levels of substance treatment works. The patient indicated that he plans to "quit on [his] own." He is considering dc to the shelter once stable. CSW will con't to follow for additional dc needs.  Employment status:  Cytogeneticist information:  Self Pay (Medicaid Pending) PT Recommendations:  Not assessed at this time Information / Referral to community resources:   Shelter, Outpatient Substance Abuse Treatment Options, Residential Substance Abuse Treatment Options  Patient/Family's Response to care:  The patient thanked the CSW for assitance.   Patient/Family's Understanding of and Emotional Response to Diagnosis, Current Treatment, and Prognosis:  The patient is in the contemplation stage of change. He understands that he has a drinking problem; however, he does not understand the severity.   Emotional Assessment Appearance:  Appears stated age Attitude/Demeanor/Rapport:   (Pleasant) Affect (typically observed):  Accepting, Appropriate, Pleasant Orientation:  Oriented to Self, Oriented to Place, Oriented to  Time, Oriented to Situation Alcohol / Substance use:  Alcohol Use (The patient reports drinking 18 beers a day for at least the past year.) Psych involvement (Current and /or in the community):  No (Comment)  Discharge Needs  Concerns to be addressed:  Substance Abuse Concerns, Homelessness, Compliance Issues Concerns Readmission within the last 30 days:  No Current discharge risk:  Substance Abuse, Homeless Barriers to Discharge:  Homeless with medical needs, Inadequate or no insurance, Active Substance Use   Zettie Pho, LCSW 05/20/2016, 2:48 PM

## 2016-05-20 NOTE — Progress Notes (Signed)
Appetite fair and tolerating. Added lisinopril for elevated blood pressure with pt already given HCTZ. Ativan po given for withdrawal symptoms per CIWA with pt reporting he felt better.Had excellent results from colace and MOM with constipation resolved. Very unsteady when up with pronounced tremors. Tylenol effective for HA.

## 2016-05-20 NOTE — Progress Notes (Addendum)
[facility] - [place] at [facility]   PATIENT NAME: Tyler Sexton    MR#:  [id number]  DATE OF BIRTH:  09-07-1960  SUBJECTIVE:  CHIEF COMPLAINT:  Patient is jittery and anxious  REVIEW OF SYSTEMS:  CONSTITUTIONAL: No fever, fatigue or weakness.  EYES: No blurred or double vision.  EARS, NOSE, AND THROAT: No tinnitus or ear pain.  RESPIRATORY: No cough, shortness of breath, wheezing or hemoptysis.  CARDIOVASCULAR: No chest pain, orthopnea, edema.  GASTROINTESTINAL: No nausea, vomiting, diarrhea or abdominal pain.  GENITOURINARY: No dysuria, hematuria.  ENDOCRINE: No polyuria, nocturia,  HEMATOLOGY: No anemia, easy bruising or bleeding SKIN: No rash or lesion. MUSCULOSKELETAL: No joint pain or arthritis.   NEUROLOGIC: No tingling, numbness, weakness.  PSYCHIATRY: No anxiety or depression.   DRUG ALLERGIES:  No Known Allergies  VITALS:  Blood pressure (!) 176/95, pulse 96, temperature 98.4 F (36.9 C), temperature source Oral, resp. rate 20, height  (1.626 m), weight 78.1 kg (172 lb 3.2 oz), SpO2 95 %.  PHYSICAL EXAMINATION:  GENERAL:  56 y.o.-year-old patient lying in the bed with no acute distress.  EYES: Pupils equal, round, reactive to light and accommodation. No scleral icterus. Extraocular muscles intact.  HEENT: Head atraumatic, normocephalic. Oropharynx and nasopharynx clear.  NECK:  Supple, no jugular venous distention. No thyroid enlargement, no tenderness.  LUNGS: Normal breath sounds bilaterally, no wheezing, rales,rhonchi or crepitation. No use of accessory muscles of respiration.  CARDIOVASCULAR: S1, S2 normal. No murmurs, rubs, or gallops.  ABDOMEN: Soft, nontender, nondistended. Bowel sounds present. No organomegaly or mass.  EXTREMITIES: No pedal edema, cyanosis, or clubbing.  NEUROLOGIC: Cranial nerves II through XII are intact. Muscle strength 5/5 in all extremities. Sensation intact. Gait not checked.  PSYCHIATRIC: The patient  is alert and oriented x 3.  SKIN: No obvious rash, lesion, or ulcer.    LABORATORY PANEL:   CBC  Recent Labs Lab 05/20/16 0429  WBC 2.9*  HGB 12.4*  HCT 35.6*  PLT 71*   ------------------------------------------------------------------------------------------------------------------  Chemistries   Recent Labs Lab 05/19/16 1752 05/19/16 1920 05/20/16 0429  NA 134*  --  140  K 3.4*  --  3.3*  CL 99*  --  108  CO2 21*  --  24  GLUCOSE 166*  --  92  BUN 7  --  6  CREATININE 0.73  --  0.50*  CALCIUM 8.6*  --  8.0*  MG  --  1.9  --   AST 353*  --   --   ALT 165*  --   --   ALKPHOS 74  --   --   BILITOT 1.3*  --   --    ------------------------------------------------------------------------------------------------------------------  Cardiac Enzymes  Recent Labs Lab 05/19/16 1920  TROPONINI <0.03   ------------------------------------------------------------------------------------------------------------------  RADIOLOGY:  Ct Angio Head W Or Wo Contrast  Result Date: 05/19/2016 CLINICAL DATA:  Acute right leg weakness and numbness. EXAM: CT ANGIOGRAPHY HEAD AND NECK TECHNIQUE: Multidetector CT imaging of the head and neck was performed using the standard protocol during bolus administration of intravenous contrast. Multiplanar CT image reconstructions and MIPs were obtained to evaluate the vascular anatomy. Carotid stenosis measurements (when applicable) are obtained utilizing NASCET criteria, using the distal internal carotid diameter as the denominator. CONTRAST:  75 mL Isovue 370 COMPARISON:  None. FINDINGS: CT HEAD FINDINGS Brain: There is no evidence of acute cortical infarct, intracranial hemorrhage, mass, midline shift, or extra-axial fluid collection. There is very mild cerebral and cerebellar  atrophy. Vascular: Calcified atherosclerosis at the skullbase. No hyperdense vessel. Skull: No fracture or focal osseous lesion. Sinuses: Visualized paranasal sinuses and  mastoid air cells are clear. Orbits: Unremarkable. Review of the MIP images confirms the above findings CTA NECK FINDINGS Aortic arch: Standard 3 vessel aortic arch with mild calcified atherosclerotic plaque. Widely patent brachiocephalic and subclavian arteries. Right carotid system: Patent without evidence of dissection. Calcified plaque is mild in the proximal and mid common carotid artery and moderate about the carotid bifurcation without significant stenosis. Left carotid system: Patent without evidence of dissection. Mild-to-moderate calcified plaque in the mid common carotid artery and about the carotid bifurcation without stenosis. Vertebral arteries: Patent without evidence of dissection or stenosis. Moderately to strongly dominant left vertebral artery. Skeleton: Mild lower cervical disc degeneration. Other neck: No mass or lymph node enlargement. Upper chest: Clear lung apices. Review of the MIP images confirms the above findings CTA HEAD FINDINGS Anterior circulation: The internal carotid arteries are patent from skullbase to carotid termini. There is mild right and moderate left carotid siphon calcified plaque without significant stenosis. ACAs and MCAs are patent without evidence of major branch occlusion or significant stenosis. No aneurysm. Posterior circulation: The intracranial vertebral arteries are patent without stenosis. The right vertebral artery is markedly hypoplastic distal to the PICA origin. Left PICA also appears patent. Left AICA appears dominant. SCA origins are patent bilaterally. Basilar artery is widely patent. There is a patent left posterior communicating artery with hypoplastic left P1 segment. PCAs are patent without evidence of significant stenosis. No aneurysm. Venous sinuses: Patent. Anatomic variants: Hypoplastic left P1. Delayed phase: No abnormal enhancement. Review of the MIP images confirms the above findings IMPRESSION: 1. No evidence of acute intracranial abnormality.  2. No large vessel occlusion. 3. 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 Electronically Signed   By: Sebastian Ache M.D.   On: 05/19/2016 19:20   Ct Angio Neck W And/or Wo Contrast  Result Date: 05/19/2016 CLINICAL DATA:  Acute right leg weakness and numbness. EXAM: CT ANGIOGRAPHY HEAD AND NECK TECHNIQUE: Multidetector CT imaging of the head and neck was performed using the standard protocol during bolus administration of intravenous contrast. Multiplanar CT image reconstructions and MIPs were obtained to evaluate the vascular anatomy. Carotid stenosis measurements (when applicable) are obtained utilizing NASCET criteria, using the distal internal carotid diameter as the denominator. CONTRAST:  75 mL Isovue 370 COMPARISON:  None. FINDINGS: CT HEAD FINDINGS Brain: There is no evidence of acute cortical infarct, intracranial hemorrhage, mass, midline shift, or extra-axial fluid collection. There is very mild cerebral and cerebellar atrophy. Vascular: Calcified atherosclerosis at the skullbase. No hyperdense vessel. Skull: No fracture or focal osseous lesion. Sinuses: Visualized paranasal sinuses and mastoid air cells are clear. Orbits: Unremarkable. Review of the MIP images confirms the above findings CTA NECK FINDINGS Aortic arch: Standard 3 vessel aortic arch with mild calcified atherosclerotic plaque. Widely patent brachiocephalic and subclavian arteries. Right carotid system: Patent without evidence of dissection. Calcified plaque is mild in the proximal and mid common carotid artery and moderate about the carotid bifurcation without significant stenosis. Left carotid system: Patent without evidence of dissection. Mild-to-moderate calcified plaque in the mid common carotid artery and about the carotid bifurcation without stenosis. Vertebral arteries: Patent without evidence of dissection or stenosis. Moderately to strongly dominant left vertebral artery. Skeleton: Mild  lower cervical disc degeneration. Other neck: No mass or lymph node enlargement. Upper chest: Clear lung apices. Review of the MIP images confirms  the above findings CTA HEAD FINDINGS Anterior circulation: The internal carotid arteries are patent from skullbase to carotid termini. There is mild right and moderate left carotid siphon calcified plaque without significant stenosis. ACAs and MCAs are patent without evidence of major branch occlusion or significant stenosis. No aneurysm. Posterior circulation: The intracranial vertebral arteries are patent without stenosis. The right vertebral artery is markedly hypoplastic distal to the PICA origin. Left PICA also appears patent. Left AICA appears dominant. SCA origins are patent bilaterally. Basilar artery is widely patent. There is a patent left posterior communicating artery with hypoplastic left P1 segment. PCAs are patent without evidence of significant stenosis. No aneurysm. Venous sinuses: Patent. Anatomic variants: Hypoplastic left P1. Delayed phase: No abnormal enhancement. Review of the MIP images confirms the above findings IMPRESSION: 1. No evidence of acute intracranial abnormality. 2. No large vessel occlusion. 3. 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 Electronically Signed   By: Sebastian Ache M.D.   On: 05/19/2016 19:20   Dg Foot Complete Left  Result Date: 05/19/2016 CLINICAL DATA:  Status post fall, with left foot pain. Initial encounter. EXAM: LEFT FOOT - COMPLETE 3+ VIEW COMPARISON:  None. FINDINGS: There is no evidence of fracture or dislocation. The joint spaces are preserved. There is no evidence of talar subluxation; the subtalar joint is unremarkable in appearance. A plantar calcaneal spur is noted. An ankle joint effusion is noted. IMPRESSION: 1. No evidence of fracture or dislocation. 2. Ankle joint effusion seen. Electronically Signed   By: Roanna Raider M.D.   On: 05/19/2016 19:52    EKG:    Orders placed or performed during the hospital encounter of 05/19/16  . ED EKG  . ED EKG    ASSESSMENT AND PLAN:   * Alcohol withdrawal CIWA Multivitamin, thiamine and folate   Patient is at very high risk of going into alcohol withdrawal he already appears to have early signs of withdrawal.   We'll give IV fluids, give Ativan IV and by mouth as needed. Outpatient alcohol anonymous  * NEW Diagnosis of hypertension Patient is started on hydrochlorothiazide and this will titrate as needed    * Bilateral lower activity weakness    CT had an angiogram are negative.    Most likely this may be secondary to thiamine deficiency, provide thiamine    * Diabetes  on insulin sliding scale coverage.  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   Likely due to excessive alcoholism, continue monitoring.  *Disposition patient is homeless Social services consulted      All the records are reviewed and case discussed with Care Management/Social Workerr. Management plans discussed with the patient, family and they are in agreement.  CODE STATUS: fc   TOTAL TIME TAKING CARE OF THIS PATIENT: 36 minutes.   POSSIBLE D/C IN 1-2  DAYS, DEPENDING ON CLINICAL CONDITION.  Note: This dictation was prepared with Dragon dictation along with smaller phrase technology. Any transcriptional errors that result from this process are unintentional.   Ramonita Lab M.D on 05/20/2016 at 12:43 PM  Between 7am to 6pm - Pager - [phone] After 6pm go to [email or web address] - password [email or web address] [facility]  [facility] [facility]  Office  [phone]  CC: Primary care physician; No PCP Per Patient

## 2016-05-21 LAB — COMPREHENSIVE METABOLIC PANEL
ALT: 157 U/L — ABNORMAL HIGH (ref 17–63)
AST: 301 U/L — AB (ref 15–41)
Albumin: 4.2 g/dL (ref 3.5–5.0)
Alkaline Phosphatase: 90 U/L (ref 38–126)
Anion gap: 8 (ref 5–15)
BILIRUBIN TOTAL: 2.3 mg/dL — AB (ref 0.3–1.2)
BUN: 7 mg/dL (ref 6–20)
CO2: 29 mmol/L (ref 22–32)
Calcium: 10 mg/dL (ref 8.9–10.3)
Chloride: 99 mmol/L — ABNORMAL LOW (ref 101–111)
Creatinine, Ser: 0.66 mg/dL (ref 0.61–1.24)
GFR calc Af Amer: >60 mL/min (ref >60)
GFR calc non Af Amer: >60 mL/min (ref >60)
Glucose, Bld: 121 mg/dL — ABNORMAL HIGH (ref 65–99)
POTASSIUM: 3.6 mmol/L (ref 3.5–5.1)
SODIUM: 136 mmol/L (ref 135–145)
TOTAL PROTEIN: 7.1 g/dL (ref 6.5–8.1)

## 2016-05-21 LAB — CBC
HEMATOCRIT: 39.5 % — AB (ref 40.0–52.0)
HEMOGLOBIN: 13.8 g/dL (ref 13.0–18.0)
MCH: 34.3 pg — AB (ref 26.0–34.0)
MCHC: 35 g/dL (ref 32.0–36.0)
MCV: 98.1 fL (ref 80.0–100.0)
Platelets: 66 10*3/uL — ABNORMAL LOW (ref 150–440)
RBC: 4.02 MIL/uL — AB (ref 4.40–5.90)
RDW: 12.6 % (ref 11.5–14.5)
WBC: 3.9 10*3/uL (ref 3.8–10.6)

## 2016-05-21 LAB — GLUCOSE, CAPILLARY
Glucose-Capillary: 99 mg/dL (ref 65–99)
Glucose-Capillary: 124 mg/dL — ABNORMAL HIGH (ref 65–99)
Glucose-Capillary: 114 mg/dL — ABNORMAL HIGH (ref 65–99)
Glucose-Capillary: 118 mg/dL — ABNORMAL HIGH (ref 65–99)

## 2016-05-21 LAB — HIV ANTIBODY (ROUTINE TESTING W REFLEX): HIV SCREEN 4TH GENERATION: NONREACTIVE

## 2016-05-21 LAB — POTASSIUM: POTASSIUM: 2.8 mmol/L — AB (ref 3.5–5.1)

## 2016-05-21 LAB — MAGNESIUM: Magnesium: 2 mg/dL (ref 1.7–2.4)

## 2016-05-21 MED ORDER — KCL IN DEXTROSE-NACL 20-5-0.9 MEQ/L-%-% IV SOLN
INTRAVENOUS | Status: DC
  Administered 2016-05-21 (×2): via INTRAVENOUS
  Filled 2016-05-21 (×5): qty 1000

## 2016-05-21 MED ORDER — LABETALOL HCL 5 MG/ML IV SOLN
10.0000 mg | INTRAVENOUS | Status: DC | PRN
  Filled 2016-05-21: qty 4

## 2016-05-21 MED ORDER — GLUCERNA SHAKE PO LIQD
237.0000 mL | Freq: Three times a day (TID) | ORAL | Status: DC
  Administered 2016-05-21 (×2): 237 mL via ORAL

## 2016-05-21 MED ORDER — POTASSIUM CHLORIDE CRYS ER 20 MEQ PO TBCR
40.0000 meq | EXTENDED_RELEASE_TABLET | ORAL | Status: AC
  Administered 2016-05-21 (×2): 40 meq via ORAL
  Filled 2016-05-21 (×2): qty 2

## 2016-05-21 NOTE — Progress Notes (Addendum)
Initial Nutrition Assessment  DOCUMENTATION CODES:   Not applicable  INTERVENTION:  1. Glucerna Shake po TID, each supplement provides 220 kcal and 10 grams of protein  NUTRITION DIAGNOSIS:   Predicted suboptimal nutrient intake related to chronic illness as evidenced by per patient/family report.  GOAL:   Patient will meet greater than or equal to 90% of their needs  MONITOR:   PO intake, I & O's, Labs, Weight trends, Supplement acceptance  REASON FOR ASSESSMENT:   Malnutrition Screening Tool    ASSESSMENT:   Tyler Sexton  is a 56 y.o. 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 He had 2-3 falls in last few weeks because of alcoholism and have some pain in his left ankle due to a fall.  Spoke with Tyler Sexton at bedside. States his normal diet is an 18 pack and a pack of nabs. Reports UBW of 170-180# Ate half a hamburger for lunch - states the fries he had were too salty. Documented PO so far 30-100% Nutrition-Focused physical exam completed. Findings are no fat depletion, no muscle depletion, and mild edema.  No acute complaints. Did have n/v upon admission - but has since resolved. Labs and medications reviewed: K 2.8 Banana Bag D5 1/2 NS + 20 KCL @ 32mL/hr --> 306 calories   Diet Order:  Diet Carb Modified Fluid consistency: Thin; Room service appropriate? Yes  Skin:  Reviewed, no issues  Last BM:  05/20/2016  Height:   Ht Readings from Last 1 Encounters:  05/19/16  (1.626 m)    Weight:   Wt Readings from Last 1 Encounters:  05/19/16 172 lb 3.2 oz (78.1 kg)    Ideal Body Weight:  59.09 kg  BMI:  Body mass index is 29.56 kg/m.  Estimated Nutritional Needs:   Kcal:  1700-2000 calories  Protein:  78-94 gm  Fluid:  >/=  1.7L  EDUCATION NEEDS:   No education needs identified at this time  Tyler Sexton. Odis Wickey, MS, RD LDN Inpatient Clinical Dietitian Pager [phone]

## 2016-05-21 NOTE — Progress Notes (Signed)
Pt feeling better for most of the day, however is now scoring a 10 on CIWA. PO ativan given. Pt states "The only way I can cope with these withdrawals is by sleeping.". Pt rested comfortably for most of the day, also had a good appetite and drank a glucerna shake. Otilio Jefferson, RN

## 2016-05-21 NOTE — Progress Notes (Signed)
[facility] - [place] at [facility]   PATIENT NAME: Tyler Sexton    MR#:  [id number]  DATE OF BIRTH:  1960/04/10  SUBJECTIVE:  CHIEF COMPLAINT:  Patient is feeling much better today. No complaints  REVIEW OF SYSTEMS:  CONSTITUTIONAL: No fever, fatigue or weakness.  EYES: No blurred or double vision.  EARS, NOSE, AND THROAT: No tinnitus or ear pain.  RESPIRATORY: No cough, shortness of breath, wheezing or hemoptysis.  CARDIOVASCULAR: No chest pain, orthopnea, edema.  GASTROINTESTINAL: No nausea, vomiting, diarrhea or abdominal pain.  GENITOURINARY: No dysuria, hematuria.  ENDOCRINE: No polyuria, nocturia,  HEMATOLOGY: No anemia, easy bruising or bleeding SKIN: No rash or lesion. MUSCULOSKELETAL: No joint pain or arthritis.   NEUROLOGIC: No tingling, numbness, weakness.  PSYCHIATRY: No anxiety or depression.   DRUG ALLERGIES:  No Known Allergies  VITALS:  Blood pressure 138/89, pulse 90, temperature 98.4 F (36.9 C), temperature source Oral, resp. rate 18, height  (1.626 m), weight 78.1 kg (172 lb 3.2 oz), SpO2 98 %.  PHYSICAL EXAMINATION:  GENERAL:  56 y.o.-year-old patient lying in the bed with no acute distress.  EYES: Pupils equal, round, reactive to light and accommodation. No scleral icterus. Extraocular muscles intact.  HEENT: Head atraumatic, normocephalic. Oropharynx and nasopharynx clear.  NECK:  Supple, no jugular venous distention. No thyroid enlargement, no tenderness.  LUNGS: Normal breath sounds bilaterally, no wheezing, rales,rhonchi or crepitation. No use of accessory muscles of respiration.  CARDIOVASCULAR: S1, S2 normal. No murmurs, rubs, or gallops.  ABDOMEN: Soft, nontender, nondistended. Bowel sounds present. No organomegaly or mass.  EXTREMITIES: No pedal edema, cyanosis, or clubbing.  NEUROLOGIC: Cranial nerves II through XII are intact. Muscle strength 5/5 in all extremities. Sensation intact. Gait not checked.   PSYCHIATRIC: The patient is alert and oriented x 3.  SKIN: No obvious rash, lesion, or ulcer.    LABORATORY PANEL:   CBC  Recent Labs Lab 05/21/16 0435  WBC 3.9  HGB 13.8  HCT 39.5*  PLT 66*   ------------------------------------------------------------------------------------------------------------------  Chemistries   Recent Labs Lab 05/20/16 1309 05/21/16 0435  NA 137  --   K 3.2* 2.8*  CL 99*  --   CO2 26  --   GLUCOSE 116*  --   BUN 5*  --   CREATININE 0.57*  --   CALCIUM 9.0  --   MG  --  2.0  AST 289*  --   ALT 148*  --   ALKPHOS 73  --   BILITOT 2.4*  --    ------------------------------------------------------------------------------------------------------------------  Cardiac Enzymes  Recent Labs Lab 05/19/16 1920  TROPONINI <0.03   ------------------------------------------------------------------------------------------------------------------  RADIOLOGY:  Ct Angio Head W Or Wo Contrast  Result Date: 05/19/2016 CLINICAL DATA:  Acute right leg weakness and numbness. EXAM: CT ANGIOGRAPHY HEAD AND NECK TECHNIQUE: Multidetector CT imaging of the head and neck was performed using the standard protocol during bolus administration of intravenous contrast. Multiplanar CT image reconstructions and MIPs were obtained to evaluate the vascular anatomy. Carotid stenosis measurements (when applicable) are obtained utilizing NASCET criteria, using the distal internal carotid diameter as the denominator. CONTRAST:  75 mL Isovue 370 COMPARISON:  None. FINDINGS: CT HEAD FINDINGS Brain: There is no evidence of acute cortical infarct, intracranial hemorrhage, mass, midline shift, or extra-axial fluid collection. There is very mild cerebral and cerebellar atrophy. Vascular: Calcified atherosclerosis at the skullbase. No hyperdense vessel. Skull: No fracture or focal osseous lesion. Sinuses: Visualized paranasal sinuses and mastoid air cells  are clear. Orbits:  Unremarkable. Review of the MIP images confirms the above findings CTA NECK FINDINGS Aortic arch: Standard 3 vessel aortic arch with mild calcified atherosclerotic plaque. Widely patent brachiocephalic and subclavian arteries. Right carotid system: Patent without evidence of dissection. Calcified plaque is mild in the proximal and mid common carotid artery and moderate about the carotid bifurcation without significant stenosis. Left carotid system: Patent without evidence of dissection. Mild-to-moderate calcified plaque in the mid common carotid artery and about the carotid bifurcation without stenosis. Vertebral arteries: Patent without evidence of dissection or stenosis. Moderately to strongly dominant left vertebral artery. Skeleton: Mild lower cervical disc degeneration. Other neck: No mass or lymph node enlargement. Upper chest: Clear lung apices. Review of the MIP images confirms the above findings CTA HEAD FINDINGS Anterior circulation: The internal carotid arteries are patent from skullbase to carotid termini. There is mild right and moderate left carotid siphon calcified plaque without significant stenosis. ACAs and MCAs are patent without evidence of major branch occlusion or significant stenosis. No aneurysm. Posterior circulation: The intracranial vertebral arteries are patent without stenosis. The right vertebral artery is markedly hypoplastic distal to the PICA origin. Left PICA also appears patent. Left AICA appears dominant. SCA origins are patent bilaterally. Basilar artery is widely patent. There is a patent left posterior communicating artery with hypoplastic left P1 segment. PCAs are patent without evidence of significant stenosis. No aneurysm. Venous sinuses: Patent. Anatomic variants: Hypoplastic left P1. Delayed phase: No abnormal enhancement. Review of the MIP images confirms the above findings IMPRESSION: 1. No evidence of acute intracranial abnormality. 2. No large vessel occlusion. 3.  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 Electronically Signed   By: Sebastian Ache M.D.   On: 05/19/2016 19:20   Ct Angio Neck W And/or Wo Contrast  Result Date: 05/19/2016 CLINICAL DATA:  Acute right leg weakness and numbness. EXAM: CT ANGIOGRAPHY HEAD AND NECK TECHNIQUE: Multidetector CT imaging of the head and neck was performed using the standard protocol during bolus administration of intravenous contrast. Multiplanar CT image reconstructions and MIPs were obtained to evaluate the vascular anatomy. Carotid stenosis measurements (when applicable) are obtained utilizing NASCET criteria, using the distal internal carotid diameter as the denominator. CONTRAST:  75 mL Isovue 370 COMPARISON:  None. FINDINGS: CT HEAD FINDINGS Brain: There is no evidence of acute cortical infarct, intracranial hemorrhage, mass, midline shift, or extra-axial fluid collection. There is very mild cerebral and cerebellar atrophy. Vascular: Calcified atherosclerosis at the skullbase. No hyperdense vessel. Skull: No fracture or focal osseous lesion. Sinuses: Visualized paranasal sinuses and mastoid air cells are clear. Orbits: Unremarkable. Review of the MIP images confirms the above findings CTA NECK FINDINGS Aortic arch: Standard 3 vessel aortic arch with mild calcified atherosclerotic plaque. Widely patent brachiocephalic and subclavian arteries. Right carotid system: Patent without evidence of dissection. Calcified plaque is mild in the proximal and mid common carotid artery and moderate about the carotid bifurcation without significant stenosis. Left carotid system: Patent without evidence of dissection. Mild-to-moderate calcified plaque in the mid common carotid artery and about the carotid bifurcation without stenosis. Vertebral arteries: Patent without evidence of dissection or stenosis. Moderately to strongly dominant left vertebral artery. Skeleton: Mild lower cervical disc  degeneration. Other neck: No mass or lymph node enlargement. Upper chest: Clear lung apices. Review of the MIP images confirms the above findings CTA HEAD FINDINGS Anterior circulation: The internal carotid arteries are patent from skullbase to carotid termini. There is mild right and moderate  left carotid siphon calcified plaque without significant stenosis. ACAs and MCAs are patent without evidence of major branch occlusion or significant stenosis. No aneurysm. Posterior circulation: The intracranial vertebral arteries are patent without stenosis. The right vertebral artery is markedly hypoplastic distal to the PICA origin. Left PICA also appears patent. Left AICA appears dominant. SCA origins are patent bilaterally. Basilar artery is widely patent. There is a patent left posterior communicating artery with hypoplastic left P1 segment. PCAs are patent without evidence of significant stenosis. No aneurysm. Venous sinuses: Patent. Anatomic variants: Hypoplastic left P1. Delayed phase: No abnormal enhancement. Review of the MIP images confirms the above findings IMPRESSION: 1. No evidence of acute intracranial abnormality. 2. No large vessel occlusion. 3. 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 Electronically Signed   By: Sebastian Ache M.D.   On: 05/19/2016 19:20   Dg Foot Complete Left  Result Date: 05/19/2016 CLINICAL DATA:  Status post fall, with left foot pain. Initial encounter. EXAM: LEFT FOOT - COMPLETE 3+ VIEW COMPARISON:  None. FINDINGS: There is no evidence of fracture or dislocation. The joint spaces are preserved. There is no evidence of talar subluxation; the subtalar joint is unremarkable in appearance. A plantar calcaneal spur is noted. An ankle joint effusion is noted. IMPRESSION: 1. No evidence of fracture or dislocation. 2. Ankle joint effusion seen. Electronically Signed   By: Roanna Raider M.D.   On: 05/19/2016 19:52    EKG:   Orders placed or  performed during the hospital encounter of 05/19/16  . ED EKG  . ED EKG    ASSESSMENT AND PLAN:   * Alcohol withdrawal CIWA Multivitamin, thiamine and folate   Patient is at very high risk of going into alcohol withdrawal he already appears to have early signs of withdrawal.   We'll give IV fluids, give Ativan IV and by mouth as needed. Outpatient alcohol anonymous  * NEW Diagnosis of hypertension Patient is started on hydrochlorothiazide and this will titrate as needed    * Bilateral lower activity weakness    CT had an angiogram are negative.    Most likely this may be secondary to thiamine deficiency, provide thiamine    * Diabetes  on insulin sliding scale coverage.  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 AST and ALT are trending down. Total bili is elevated. Patient is not jaundiced Clinically symptomatic    counseled patient to stop drinking alcohol and outpatient alcohol anonymous  *Disposition patient is homeless Social services consulted, waiting for placement into a shelter most likely tomorrow      All the records are reviewed and case discussed with Care Management/Social Workerr. Management plans discussed with the patient, family and they are in agreement.  CODE STATUS: fc   TOTAL TIME TAKING CARE OF THIS PATIENT: 36 minutes.   POSSIBLE D/C IN 1-2  DAYS, DEPENDING ON CLINICAL CONDITION.  Note: This dictation was prepared with Dragon dictation along with smaller phrase technology. Any transcriptional errors that result from this process are unintentional.   Ramonita Lab M.D on 05/21/2016 at 1:44 PM  Between 7am to 6pm - Pager - [phone] After 6pm go to [email or web address] - password [email or web address] [facility]  [place] [facility]  Office  [phone]  CC: Primary care physician; No PCP Per  Patient

## 2016-05-22 LAB — BASIC METABOLIC PANEL
Anion gap: 8 (ref 5–15)
BUN: 8 mg/dL (ref 6–20)
CALCIUM: 9.2 mg/dL (ref 8.9–10.3)
CHLORIDE: 101 mmol/L (ref 101–111)
CO2: 28 mmol/L (ref 22–32)
Creatinine, Ser: 0.5 mg/dL — ABNORMAL LOW (ref 0.61–1.24)
Glucose, Bld: 118 mg/dL — ABNORMAL HIGH (ref 65–99)
Potassium: 3.2 mmol/L — ABNORMAL LOW (ref 3.5–5.1)
SODIUM: 137 mmol/L (ref 135–145)

## 2016-05-22 LAB — GLUCOSE, CAPILLARY
GLUCOSE-CAPILLARY: 108 mg/dL — AB (ref 65–99)
GLUCOSE-CAPILLARY: 106 mg/dL — AB (ref 65–99)

## 2016-05-22 MED ORDER — GLUCERNA SHAKE PO LIQD: 237.0000 mL | Freq: Three times a day (TID) | ORAL | 0 refills | Status: AC

## 2016-05-22 MED ORDER — ADULT MULTIVITAMIN W/MINERALS CH: 1.0000 | ORAL_TABLET | Freq: Every day | ORAL | Status: AC

## 2016-05-22 MED ORDER — POTASSIUM CHLORIDE CRYS ER 20 MEQ PO TBCR
40.0000 meq | EXTENDED_RELEASE_TABLET | Freq: Once | ORAL | Status: AC
  Administered 2016-05-22: 14:00:00 40 meq via ORAL
  Filled 2016-05-22: qty 2

## 2016-05-22 MED ORDER — FOLIC ACID 1 MG PO TABS: 1.0000 mg | ORAL_TABLET | Freq: Every day | ORAL | 0 refills | Status: AC

## 2016-05-22 MED ORDER — LISINOPRIL 20 MG PO TABS: 20.0000 mg | ORAL_TABLET | Freq: Every day | ORAL | Status: DC

## 2016-05-22 MED ORDER — HYDROCHLOROTHIAZIDE 25 MG PO TABS: 25.0000 mg | ORAL_TABLET | Freq: Every day | ORAL | 0 refills | Status: AC

## 2016-05-22 MED ORDER — THIAMINE HCL 100 MG PO TABS: 100.0000 mg | ORAL_TABLET | Freq: Every day | ORAL | Status: AC

## 2016-05-22 MED ORDER — DOCUSATE SODIUM 100 MG PO CAPS: 100.0000 mg | ORAL_CAPSULE | Freq: Two times a day (BID) | ORAL | 0 refills | Status: AC | PRN

## 2016-05-22 MED ORDER — LISINOPRIL 10 MG PO TABS
10.0000 mg | ORAL_TABLET | Freq: Once | ORAL | Status: AC
  Administered 2016-05-22: 14:00:00 10 mg via ORAL
  Filled 2016-05-22: qty 1

## 2016-05-22 MED ORDER — LISINOPRIL 20 MG PO TABS: 20.0000 mg | ORAL_TABLET | Freq: Every day | ORAL | 0 refills | Status: AC

## 2016-05-22 NOTE — Discharge Summary (Signed)
[facility] [place] at [facility]   PATIENT NAME: Tyler Sexton    MR#:  [id number]  DATE OF BIRTH:  September 13, 1960  DATE OF ADMISSION:  05/19/2016 ADMITTING PHYSICIAN: Altamese Dilling, MD  DATE OF DISCHARGE: 05/22/16 PRIMARY CARE PHYSICIAN: No PCP Per Patient    ADMISSION DIAGNOSIS:  Weakness of right leg [R29.898] Alcohol withdrawal syndrome without complication (HCC) [F10.230] Alcoholic intoxication with complication (HCC) [F10.929]  DISCHARGE DIAGNOSIS:  Principal Problem:   Alcohol withdrawal (HCC)  Hypokalemia  htn  DM SECONDARY DIAGNOSIS:   Past Medical History:  Diagnosis Date  . Diabetes mellitus without complication [facility])     HOSPITAL COURSE:  HISTORY OF PRESENT ILLNESS: Tyler Sexton  is a 56 y.o. 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 He had 2-3 falls in last few weeks because of alcoholism and have some pain in his left ankle due to a fall. For last 2-3 days he started having some headache and he also have complain of palpitation. He have his right leg weakness also and he feels he will be falling down if he tried to stand up concerned with this he came to emergency room. CT scan of the head and CT angiogram are negative for any acute injuries his x-ray on left feet also does not show any acute fractures but he appears to be going into alcohol withdrawal and so given to hospitalist team for further management as patient agrees he would like to quit alcohol. There is also a concern by ER physician offered him having a stroke so suggested to do an MRI on the brain  Hospital course   * Alcohol withdrawal CIWA Multivitamin, thiamine and folate Patient is at very high risk of going into alcohol withdrawal he already appears  to have early signs of withdrawal. We'll give IV fluids, give Ativan IV and by mouth as needed. Outpatient alcohol anonymous  * Hypokalemia- potassium repleted  * NEW Diagnosis of hypertension Patient is started on hydrochlorothiazide and this will titrate as needed  * Bilateral lower activity weakness CT had an angiogram are negative. Most likely this may be secondary to thiamine deficiency, provide thiamine  * Diabetes on insulin sliding scale coverage.  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 AST and ALT are trending down. Total bili is elevated. Patient is not jaundiced Clinically symptomatic  counseled patient to stop drinking alcohol and outpatient alcohol anonymous  *Disposition patient is homeless Social services consulted, waiting for placement into a shelter most likely tomorrow     DISCHARGE CONDITIONS:   stable  CONSULTS OBTAINED:     PROCEDURES none  DRUG ALLERGIES:  No Known Allergies  DISCHARGE MEDICATIONS:   Current Discharge Medication List    START taking these medications   Details  docusate sodium (COLACE) 100 MG capsule Take 1 capsule (100 mg total) by mouth 2 (two) times daily as needed for mild constipation. Qty: 10 capsule, Refills: 0    feeding supplement, GLUCERNA SHAKE, (GLUCERNA SHAKE) LIQD Take 237 mLs by mouth 3 (three) times daily between meals. Qty: 90 Can, Refills: 0    folic  acid (FOLVITE) 1 MG tablet Take 1 tablet (1 mg total) by mouth daily. Qty: 30 tablet, Refills: 0    hydrochlorothiazide (HYDRODIURIL) 25 MG tablet Take 1 tablet (25 mg total) by mouth daily. Qty: 30 tablet, Refills: 0    lisinopril (PRINIVIL,ZESTRIL) 20 MG tablet Take 1 tablet (20 mg total) by mouth daily. Qty: 30 tablet, Refills: 0    Multiple Vitamin (MULTIVITAMIN  WITH MINERALS) TABS tablet Take 1 tablet by mouth daily.    thiamine 100 MG tablet Take 1 tablet (100 mg total) by mouth daily.         DISCHARGE INSTRUCTIONS:  Stop drinking, outpatient follow-up with alcohol and anonymous Follow-up with primary care physician in a week   DIET:  Diabetic diet, low salt  DISCHARGE CONDITION:  Stable  ACTIVITY:  Activity as tolerated  OXYGEN:  Home Oxygen: No.   Oxygen Delivery: room air  DISCHARGE LOCATION:  home   If you experience worsening of your admission symptoms, develop shortness of breath, life threatening emergency, suicidal or homicidal thoughts you must seek medical attention immediately by calling 911 or calling your MD immediately  if symptoms less severe.  You Must read complete instructions/literature along with all the possible adverse reactions/side effects for all the Medicines you take and that have been prescribed to you. Take any new Medicines after you have completely understood and accpet all the possible adverse reactions/side effects.   Please note  You were cared for by a hospitalist during your hospital stay. If you have any questions about your discharge medications or the care you received while you were in the hospital after you are discharged, you can call the unit and asked to speak with the hospitalist on call if the hospitalist that took care of you is not available. Once you are discharged, your primary care physician will handle any further medical issues. Please note that NO REFILLS for any discharge medications will be authorized once you are discharged, as it is imperative that you return to your primary care physician (or establish a relationship with a primary care physician if you do not have one) for your aftercare needs so that they can reassess your need for medications and monitor your lab values.     Today  Chief Complaint  Patient presents with  . Alcohol Intoxication   Patient is doing  much better. Denies any anxiety and shakes. Wants to go to a shelt and is *Hypokalemia potassium repleteder  ROS:  CONSTITUTIONAL: Denies fevers, chills. Denies any fatigue, weakness.  EYES: Denies blurry vision, double vision, eye pain. EARS, NOSE, THROAT: Denies tinnitus, ear pain, hearing loss. RESPIRATORY: Denies cough, wheeze, shortness of breath.  CARDIOVASCULAR: Denies chest pain, palpitations, edema.  GASTROINTESTINAL: Denies nausea, vomiting, diarrhea, abdominal pain. Denies bright red blood per rectum. GENITOURINARY: Denies dysuria, hematuria. ENDOCRINE: Denies nocturia or thyroid problems. HEMATOLOGIC AND LYMPHATIC: Denies easy bruising or bleeding. SKIN: Denies rash or lesion. MUSCULOSKELETAL: Denies pain in neck, back, shoulder, knees, hips or arthritic symptoms.  NEUROLOGIC: Denies paralysis, paresthesias.  PSYCHIATRIC: Denies anxiety or depressive symptoms.   VITAL SIGNS:  Blood pressure (!) 167/96, pulse 92, temperature 98.4 F (36.9 C), temperature source Oral, resp. rate 20, height  (1.626 m), weight 78.1 kg (172 lb 3.2 oz), SpO2 98 %.  I/O:    Intake/Output Summary (Last 24 hours) at 05/22/16 1320 Last data filed at 05/22/16 0800  Gross per 24 hour  Intake  2045 ml  Output              600 ml  Net             1445 ml    PHYSICAL EXAMINATION:  GENERAL:  56 y.o.-year-old patient lying in the bed with no acute distress.  EYES: Pupils equal, round, reactive to light and accommodation. No scleral icterus. Extraocular muscles intact.  HEENT: Head atraumatic, normocephalic. Oropharynx and nasopharynx clear.  NECK:  Supple, no jugular venous distention. No thyroid enlargement, no tenderness.  LUNGS: Normal breath sounds bilaterally, no wheezing, rales,rhonchi or crepitation. No use of accessory muscles of respiration.  CARDIOVASCULAR: S1, S2 normal. No murmurs, rubs, or gallops.  ABDOMEN: Soft, non-tender, non-distended. Bowel sounds present. No  organomegaly or mass.  EXTREMITIES: No pedal edema, cyanosis, or clubbing.  NEUROLOGIC: Cranial nerves II through XII are intact. Muscle strength 5/5 in all extremities. Sensation intact. Gait not checked.  PSYCHIATRIC: The patient is alert and oriented x 3.  SKIN: No obvious rash, lesion, or ulcer.   DATA REVIEW:   CBC  Recent Labs Lab 05/21/16 0435  WBC 3.9  HGB 13.8  HCT 39.5*  PLT 66*    Chemistries   Recent Labs Lab 05/21/16 0435 05/21/16 1405 05/22/16 0440  NA  --  136 137  K 2.8* 3.6 3.2*  CL  --  99* 101  CO2  --  29 28  GLUCOSE  --  121* 118*  BUN  --  7 8  CREATININE  --  0.66 0.50*  CALCIUM  --  10.0 9.2  MG 2.0  --   --   AST  --  301*  --   ALT  --  157*  --   ALKPHOS  --  90  --   BILITOT  --  2.3*  --     Cardiac Enzymes  Recent Labs Lab 05/19/16 1920  TROPONINI <0.03    Microbiology Results  No results found for this or any previous visit.  RADIOLOGY:  Ct Angio Head W Or Wo Contrast  Result Date: 05/19/2016 CLINICAL DATA:  Acute right leg weakness and numbness. EXAM: CT ANGIOGRAPHY HEAD AND NECK TECHNIQUE: Multidetector CT imaging of the head and neck was performed using the standard protocol during bolus administration of intravenous contrast. Multiplanar CT image reconstructions and MIPs were obtained to evaluate the vascular anatomy. Carotid stenosis measurements (when applicable) are obtained utilizing NASCET criteria, using the distal internal carotid diameter as the denominator. CONTRAST:  75 mL Isovue 370 COMPARISON:  None. FINDINGS: CT HEAD FINDINGS Brain: There is no evidence of acute cortical infarct, intracranial hemorrhage, mass, midline shift, or extra-axial fluid collection. There is very mild cerebral and cerebellar atrophy. Vascular: Calcified atherosclerosis at the skullbase. No hyperdense vessel. Skull: No fracture or focal osseous lesion. Sinuses: Visualized paranasal sinuses and mastoid air cells are clear. Orbits:  Unremarkable. Review of the MIP images confirms the above findings CTA NECK FINDINGS Aortic arch: Standard 3 vessel aortic arch with mild calcified atherosclerotic plaque. Widely patent brachiocephalic and subclavian arteries. Right carotid system: Patent without evidence of dissection. Calcified plaque is mild in the proximal and mid common carotid artery and moderate about the carotid bifurcation without significant stenosis. Left carotid system: Patent without evidence of dissection. Mild-to-moderate calcified plaque in the mid common carotid artery and about the carotid bifurcation without stenosis. Vertebral arteries: Patent without evidence of dissection or stenosis. Moderately to strongly dominant left vertebral artery. Skeleton: Mild lower cervical  disc degeneration. Other neck: No mass or lymph node enlargement. Upper chest: Clear lung apices. Review of the MIP images confirms the above findings CTA HEAD FINDINGS Anterior circulation: The internal carotid arteries are patent from skullbase to carotid termini. There is mild right and moderate left carotid siphon calcified plaque without significant stenosis. ACAs and MCAs are patent without evidence of major branch occlusion or significant stenosis. No aneurysm. Posterior circulation: The intracranial vertebral arteries are patent without stenosis. The right vertebral artery is markedly hypoplastic distal to the PICA origin. Left PICA also appears patent. Left AICA appears dominant. SCA origins are patent bilaterally. Basilar artery is widely patent. There is a patent left posterior communicating artery with hypoplastic left P1 segment. PCAs are patent without evidence of significant stenosis. No aneurysm. Venous sinuses: Patent. Anatomic variants: Hypoplastic left P1. Delayed phase: No abnormal enhancement. Review of the MIP images confirms the above findings IMPRESSION: 1. No evidence of acute intracranial abnormality. 2. No large vessel occlusion. 3.  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 Electronically Signed   By: Sebastian Ache M.D.   On: 05/19/2016 19:20   Ct Angio Neck W And/or Wo Contrast  Result Date: 05/19/2016 CLINICAL DATA:  Acute right leg weakness and numbness. EXAM: CT ANGIOGRAPHY HEAD AND NECK TECHNIQUE: Multidetector CT imaging of the head and neck was performed using the standard protocol during bolus administration of intravenous contrast. Multiplanar CT image reconstructions and MIPs were obtained to evaluate the vascular anatomy. Carotid stenosis measurements (when applicable) are obtained utilizing NASCET criteria, using the distal internal carotid diameter as the denominator. CONTRAST:  75 mL Isovue 370 COMPARISON:  None. FINDINGS: CT HEAD FINDINGS Brain: There is no evidence of acute cortical infarct, intracranial hemorrhage, mass, midline shift, or extra-axial fluid collection. There is very mild cerebral and cerebellar atrophy. Vascular: Calcified atherosclerosis at the skullbase. No hyperdense vessel. Skull: No fracture or focal osseous lesion. Sinuses: Visualized paranasal sinuses and mastoid air cells are clear. Orbits: Unremarkable. Review of the MIP images confirms the above findings CTA NECK FINDINGS Aortic arch: Standard 3 vessel aortic arch with mild calcified atherosclerotic plaque. Widely patent brachiocephalic and subclavian arteries. Right carotid system: Patent without evidence of dissection. Calcified plaque is mild in the proximal and mid common carotid artery and moderate about the carotid bifurcation without significant stenosis. Left carotid system: Patent without evidence of dissection. Mild-to-moderate calcified plaque in the mid common carotid artery and about the carotid bifurcation without stenosis. Vertebral arteries: Patent without evidence of dissection or stenosis. Moderately to strongly dominant left vertebral artery. Skeleton: Mild lower cervical disc  degeneration. Other neck: No mass or lymph node enlargement. Upper chest: Clear lung apices. Review of the MIP images confirms the above findings CTA HEAD FINDINGS Anterior circulation: The internal carotid arteries are patent from skullbase to carotid termini. There is mild right and moderate left carotid siphon calcified plaque without significant stenosis. ACAs and MCAs are patent without evidence of major branch occlusion or significant stenosis. No aneurysm. Posterior circulation: The intracranial vertebral arteries are patent without stenosis. The right vertebral artery is markedly hypoplastic distal to the PICA origin. Left PICA also appears patent. Left AICA appears dominant. SCA origins are patent bilaterally. Basilar artery is widely patent. There is a patent left posterior communicating artery with hypoplastic left P1 segment. PCAs are patent without evidence of significant stenosis. No aneurysm. Venous sinuses: Patent. Anatomic variants: Hypoplastic left P1. Delayed phase: No abnormal enhancement. Review of the MIP images confirms the  above findings IMPRESSION: 1. No evidence of acute intracranial abnormality. 2. No large vessel occlusion. 3. 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 Electronically Signed   By: Sebastian Ache M.D.   On: 05/19/2016 19:20   Dg Foot Complete Left  Result Date: 05/19/2016 CLINICAL DATA:  Status post fall, with left foot pain. Initial encounter. EXAM: LEFT FOOT - COMPLETE 3+ VIEW COMPARISON:  None. FINDINGS: There is no evidence of fracture or dislocation. The joint spaces are preserved. There is no evidence of talar subluxation; the subtalar joint is unremarkable in appearance. A plantar calcaneal spur is noted. An ankle joint effusion is noted. IMPRESSION: 1. No evidence of fracture or dislocation. 2. Ankle joint effusion seen. Electronically Signed   By: Roanna Raider M.D.   On: 05/19/2016 19:52    EKG:   Orders placed or  performed during the hospital encounter of 05/19/16  . ED EKG  . ED EKG      Management plans discussed with the patient, family and they are in agreement.  CODE STATUS:     Code Status Orders        Start     Ordered   05/19/16 2214  Full code  Continuous     05/19/16 2213    Code Status History    Date Active Date Inactive Code Status Order ID Comments User Context   This patient has a current code status but no historical code status.      TOTAL TIME TAKING CARE OF THIS PATIENT: 45  minutes.   Note: This dictation was prepared with Dragon dictation along with smaller phrase technology. Any transcriptional errors that result from this process are unintentional.   @  on 05/22/2016 at 1:20 PM  Between 7am to 6pm - Pager - [phone]  After 6pm go to [email or web address] - password [email or web address] [facility]  [facility]  Office  [phone]  CC: Primary care physician; No PCP Per Patient

## 2016-05-22 NOTE — Progress Notes (Signed)
Patient discharged to Shelter per MD order. Prescriptions given to patient. All discharge instructions given to patient and all questions answered.

## 2016-05-22 NOTE — Discharge Instructions (Signed)
Stop drinking, outpatient follow-up with alcohol and anonymous Follow-up with primary care physician in a week

## 2016-06-08 ENCOUNTER — Encounter: Payer: Self-pay | Admitting: *Deleted

## 2016-06-08 ENCOUNTER — Emergency Department
Admission: EM | Admit: 2016-06-08 | Discharge: 2016-06-08 | Disposition: A | Discharge status: Home / Self Care | Payer: Self-pay | Attending: Emergency Medicine | Admitting: Emergency Medicine

## 2016-06-08 DIAGNOSIS — Z5321 Procedure and treatment not carried out due to patient leaving prior to being seen by health care provider: Secondary | ICD-10-CM | POA: Insufficient documentation

## 2016-06-08 DIAGNOSIS — E119 Type 2 diabetes mellitus without complications: Secondary | ICD-10-CM | POA: Insufficient documentation

## 2016-06-08 DIAGNOSIS — F10129 Alcohol abuse with intoxication, unspecified: Secondary | ICD-10-CM | POA: Insufficient documentation
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
## 2017-03-23 DEATH — deceased

## 2017-09-04 IMAGING — CT CT ANGIO NECK
1 of 11 series · 6 of 33 positions shown · IV contrast (APPLIED)
Comparison: None.

CLINICAL DATA: Acute right leg weakness and numbness.

EXAM:
CT ANGIOGRAPHY HEAD AND NECK
TECHNIQUE: Multidetector CT imaging of the head and neck was performed using
the standard protocol during bolus administration of intravenous
contrast. Multiplanar CT image reconstructions and MIPs were
obtained to evaluate the vascular anatomy. Carotid stenosis
measurements (when applicable) are obtained utilizing NASCET
criteria, using the distal internal carotid diameter as the
denominator.
CONTRAST:  75 mL Isovue 370

[Series 6: ax thin · axial · 0.43mm/px · z∈[-289,-38]mm · 6 of 353 slices shown]
[im 51/353  soft-tissue]
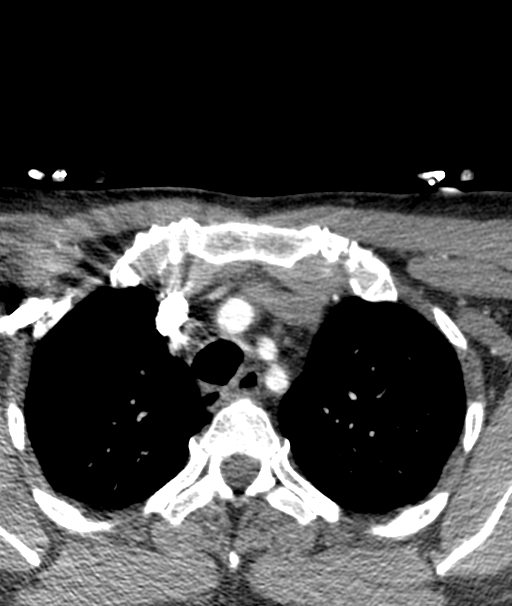
[im 101/353  bone]
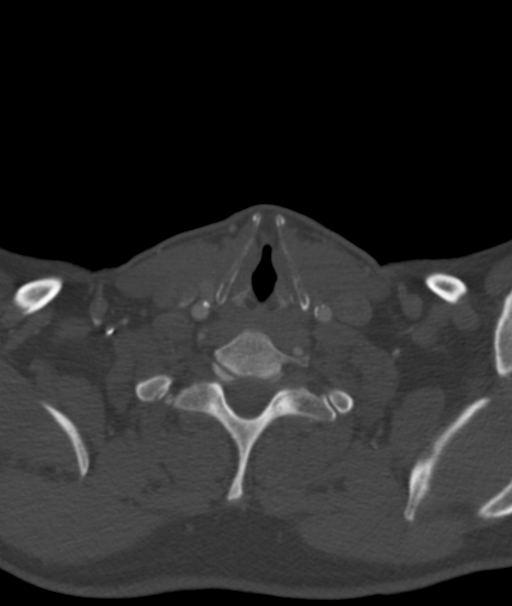
[im 151/353  soft-tissue]
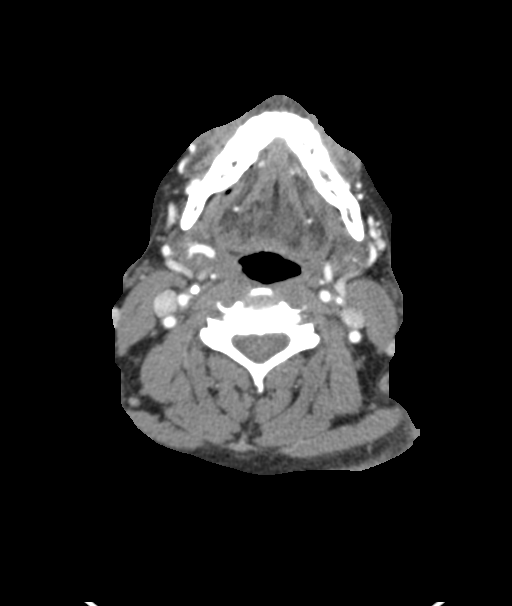
[im 202/353  bone]
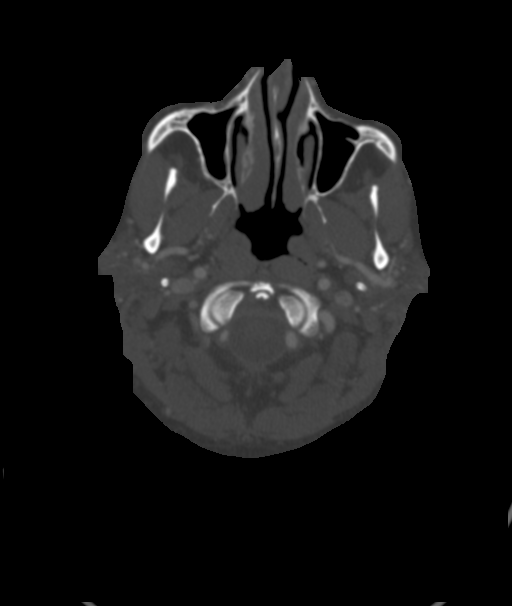
[im 252/353  soft-tissue]
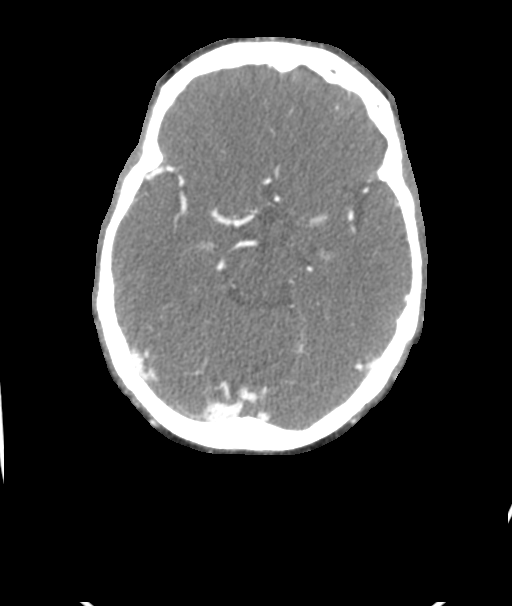
[im 302/353  bone]
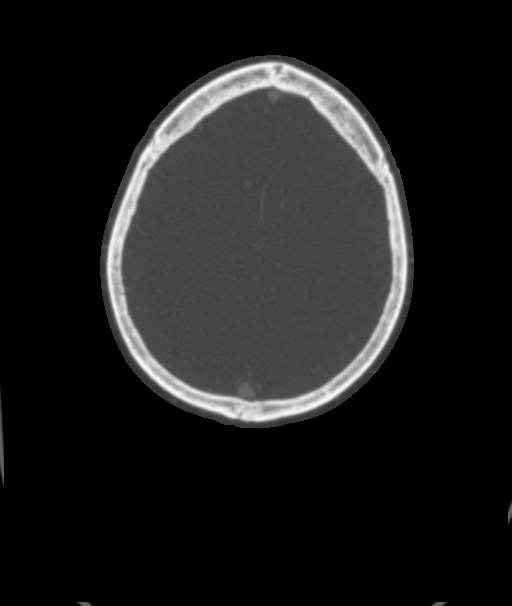

[6 of 33 positions shown; findings below may reference images not displayed]

FINDINGS: CT HEAD FINDINGS

Brain: There is no evidence of acute cortical infarct, intracranial
hemorrhage, mass, midline shift, or extra-axial fluid collection.
There is very mild cerebral and cerebellar atrophy.

Vascular: Calcified atherosclerosis at the skullbase. No hyperdense
vessel.

Skull: No fracture or focal osseous lesion.

Sinuses: Visualized paranasal sinuses and mastoid air cells are
clear.

Orbits: Unremarkable.

Review of the MIP images confirms the above findings

CTA NECK FINDINGS

Aortic arch: Standard 3 vessel aortic arch with mild calcified
atherosclerotic plaque. Widely patent brachiocephalic and subclavian
arteries.

Right carotid system: Patent without evidence of dissection.
Calcified plaque is mild in the proximal and mid common carotid
artery and moderate about the carotid bifurcation without
significant stenosis.

Left carotid system: Patent without evidence of dissection.
Mild-to-moderate calcified plaque in the mid common carotid artery
and about the carotid bifurcation without stenosis.

Vertebral arteries: Patent without evidence of dissection or
stenosis. Moderately to strongly dominant left vertebral artery.

Skeleton: Mild lower cervical disc degeneration.

Other neck: No mass or lymph node enlargement.

Upper chest: Clear lung apices.

Review of the MIP images confirms the above findings

CTA HEAD FINDINGS

Anterior circulation: The internal carotid arteries are patent from
skullbase to carotid termini. There is mild right and moderate left
carotid siphon calcified plaque without significant stenosis. ACAs
and MCAs are patent without evidence of major branch occlusion or
significant stenosis. No aneurysm.

Posterior circulation: The intracranial vertebral arteries are
patent without stenosis. The right vertebral artery is markedly
hypoplastic distal to the PICA origin. Left PICA also appears
patent. Left AICA appears dominant. SCA origins are patent
bilaterally. Basilar artery is widely patent. There is a patent left
posterior communicating artery with hypoplastic left P1 segment.
PCAs are patent without evidence of significant stenosis. No
aneurysm.

Venous sinuses: Patent.

Anatomic variants: Hypoplastic left P1.

Delayed phase: No abnormal enhancement.

Review of the MIP images confirms the above findings
IMPRESSION: 1. No evidence of acute intracranial abnormality.
2. No large vessel occlusion.
3. Cervical carotid and intracranial ICA atherosclerosis without
significant stenosis.
